# Patient Record
Sex: Female | Born: 1975 | Race: White | Hispanic: No | Marital: Married | Smoking: Never smoker
Health system: Southern US, Community
[De-identification: ages and names within clinical notes are randomized; demographics above are authoritative.]

## PROBLEM LIST (undated history)

## (undated) DIAGNOSIS — N651 Disproportion of reconstructed breast: Secondary | ICD-10-CM

## (undated) DIAGNOSIS — Z9889 Other specified postprocedural states: Secondary | ICD-10-CM

## (undated) DIAGNOSIS — T4145XA Adverse effect of unspecified anesthetic, initial encounter: Secondary | ICD-10-CM

## (undated) DIAGNOSIS — Z98811 Dental restoration status: Secondary | ICD-10-CM

## (undated) DIAGNOSIS — Z86000 Personal history of in-situ neoplasm of breast: Secondary | ICD-10-CM

## (undated) DIAGNOSIS — R112 Nausea with vomiting, unspecified: Secondary | ICD-10-CM

## (undated) DIAGNOSIS — T8859XA Other complications of anesthesia, initial encounter: Secondary | ICD-10-CM

## (undated) HISTORY — PX: WISDOM TOOTH EXTRACTION: SHX21

---

## 2014-05-30 ENCOUNTER — Other Ambulatory Visit: Payer: Self-pay | Admitting: Obstetrics and Gynecology

## 2014-05-30 DIAGNOSIS — R921 Mammographic calcification found on diagnostic imaging of breast: Secondary | ICD-10-CM

## 2014-06-07 ENCOUNTER — Ambulatory Visit
Admission: RE | Admit: 2014-06-07 | Discharge: 2014-06-07 | Disposition: A | Discharge status: Home / Self Care | Payer: No Typology Code available for payment source | Source: Ambulatory Visit | Attending: Obstetrics and Gynecology | Admitting: Obstetrics and Gynecology

## 2014-06-07 DIAGNOSIS — R921 Mammographic calcification found on diagnostic imaging of breast: Secondary | ICD-10-CM

## 2014-06-07 HISTORY — PX: BREAST BIOPSY: SHX20

## 2014-06-10 ENCOUNTER — Ambulatory Visit
Admission: RE | Admit: 2014-06-10 | Discharge: 2014-06-10 | Disposition: A | Discharge status: Home / Self Care | Payer: No Typology Code available for payment source | Source: Ambulatory Visit | Attending: Obstetrics and Gynecology | Admitting: Obstetrics and Gynecology

## 2014-06-10 ENCOUNTER — Other Ambulatory Visit: Payer: Self-pay | Admitting: Obstetrics and Gynecology

## 2014-06-10 DIAGNOSIS — R921 Mammographic calcification found on diagnostic imaging of breast: Secondary | ICD-10-CM

## 2014-06-14 ENCOUNTER — Other Ambulatory Visit: Payer: Self-pay | Admitting: *Deleted

## 2014-06-14 ENCOUNTER — Other Ambulatory Visit: Payer: Self-pay | Admitting: General Surgery

## 2014-06-14 DIAGNOSIS — D0511 Intraductal carcinoma in situ of right breast: Secondary | ICD-10-CM

## 2014-06-17 ENCOUNTER — Telehealth: Payer: Self-pay | Admitting: *Deleted

## 2014-06-17 NOTE — Telephone Encounter (Signed)
Received office note from Dr. Donne Hazel.  Placed copy in Dr. Geralyn Flash box and took one to HIM to scan.

## 2014-06-17 NOTE — Telephone Encounter (Signed)
Received an email stating that the pt needs an urgent genetic appt.  Called pt and confirmed 06/18/14 appt.  Emailed Anderson Malta and Dr. Donne Hazel at [facility] to make them aware.

## 2014-06-17 NOTE — Telephone Encounter (Signed)
Received referral from [facility].  Called pt and confirmed 06/25/14 appt w/ pt.  Mailed before appt letter, calendar, welcoming packet & intake form to pt. Emailed Dr. Donne Hazel at [facility] requesting his office note to be faxed after seeing pt.  Emailed Engineer, civil (consulting) at [employer] to make her aware.  Beth emailed Tiffany about PCP referral.

## 2014-06-18 ENCOUNTER — Other Ambulatory Visit: Payer: No Typology Code available for payment source

## 2014-06-18 ENCOUNTER — Ambulatory Visit
Admission: RE | Admit: 2014-06-18 | Discharge: 2014-06-18 | Disposition: A | Discharge status: Home / Self Care | Payer: No Typology Code available for payment source | Source: Ambulatory Visit | Attending: General Surgery | Admitting: General Surgery

## 2014-06-18 ENCOUNTER — Encounter: Payer: Self-pay | Admitting: Genetic Counselor

## 2014-06-18 ENCOUNTER — Ambulatory Visit (HOSPITAL_BASED_OUTPATIENT_CLINIC_OR_DEPARTMENT_OTHER): Payer: No Typology Code available for payment source | Admitting: Genetic Counselor

## 2014-06-18 DIAGNOSIS — D0511 Intraductal carcinoma in situ of right breast: Secondary | ICD-10-CM

## 2014-06-18 DIAGNOSIS — Z8042 Family history of malignant neoplasm of prostate: Secondary | ICD-10-CM

## 2014-06-18 DIAGNOSIS — Z800 Family history of malignant neoplasm of digestive organs: Secondary | ICD-10-CM

## 2014-06-18 DIAGNOSIS — Z809 Family history of malignant neoplasm, unspecified: Secondary | ICD-10-CM

## 2014-06-18 DIAGNOSIS — Z803 Family history of malignant neoplasm of breast: Secondary | ICD-10-CM | POA: Diagnosis not present

## 2014-06-18 DIAGNOSIS — Z808 Family history of malignant neoplasm of other organs or systems: Secondary | ICD-10-CM

## 2014-06-18 DIAGNOSIS — D0591 Unspecified type of carcinoma in situ of right breast: Secondary | ICD-10-CM

## 2014-06-18 DIAGNOSIS — Z8049 Family history of malignant neoplasm of other genital organs: Secondary | ICD-10-CM

## 2014-06-18 DIAGNOSIS — Z315 Encounter for genetic counseling: Secondary | ICD-10-CM

## 2014-06-18 MED ORDER — GADOBENATE DIMEGLUMINE 529 MG/ML IV SOLN: 9.0000 mL | Freq: Once | INTRAVENOUS | Status: AC | PRN

## 2014-06-18 NOTE — Progress Notes (Signed)
REFERRING PROVIDER: Rolm Bookbinder, MD Tara Lose, MD  PRIMARY PROVIDER:  No primary care provider on file.  PRIMARY REASON FOR VISIT:  1. Breast cancer in situ, right   2. Family history of breast cancer in female   3. Family history of prostate cancer   4. Family history of uterine cancer   5. Family history of liver cancer   6. Family history of skin cancer   7. Family history of cancer      HISTORY OF PRESENT ILLNESS:   Ms. Tara Fuller, a 39 y.o. female, was seen for a [facility] cancer genetics consultation at the request of Dr. Donne Fuller due to a personal history of breast cancer at the age of 32 and family history of breast and other cancers.  Ms. Tara Fuller presents to clinic today with her husband to discuss the possibility of a hereditary predisposition to cancer, genetic testing, and to further clarify her future cancer risks, as well as potential cancer risks for family members.   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 The hormone receptor status of the breast cancer was ER/PR+.  Treatment/surgical management decisions will be made over the next few weeks.   CANCER HISTORY:   No history exists.  June 2016 - DCIS of right breast; ER/PR+   HORMONAL RISK FACTORS:  Menarche was at age 57.  First live birth at age 59.  OCP use for approximately 15 years.  Ovaries intact: yes.  Hysterectomy: no.  Menopausal status: premenopausal.  HRT use: 0 years. Colonoscopy: no; not examined. Mammogram within the last year: yes. Number of breast biopsies: 1. Up to date with pelvic exams:  yes. Any excessive radiation exposure in the past:  no  Past Medical History  Diagnosis Date  . Breast cancer 06/07/14    R DCIS +calcifications    History reviewed. No pertinent past surgical history.  History   Social History  . Marital Status: Married    Spouse Name: N/A  . Number of Children: N/A  . Years of Education: N/A    Social History Main Topics  . Smoking status: Never Smoker   . Smokeless tobacco: Not on file  . Alcohol Use: Yes     Comment: occ  . Drug Use: Not on file  . Sexual Activity: Not on file   Other Topics Concern  . None   Social History Narrative  . None     FAMILY HISTORY:  We obtained a detailed, 4-generation family history.  Significant diagnoses are listed below: Family History  Problem Relation Age of Onset  . Endometriosis Sister   . Liver cancer Maternal Aunt     dx. 21s  . Crohn's disease Maternal Uncle   . Uterine cancer Maternal Grandmother   . Prostate cancer Maternal Grandfather     dx. 60s  . Skin cancer Paternal Grandfather   . Breast cancer Maternal Aunt 58  . Breast cancer Maternal Aunt 41    previous (-) genetic testing    Ms. Tara Fuller has a maternal family history of breast and other cancers.  Two maternal aunts were diagnosed with breast cancer at the ages of 38 and 69.  One of those maternal aunts had negative genetic testing for a custom panel of 13 genes related to hereditary breast cancer through [employer].  Another maternal aunt was diagnosed with liver cancer in her 65s.  Three paternal uncles have never had cancer.  Ms.  Tara Fuller's maternal grandmother was previously diagnosed with uterine cancer, and her maternal grandfather was diagnosed with prostate cancer in his 32s.  Ms. Tara Fuller maternal great grandmother (through her grandmother) was diagnosed with an unspecified type of cancer.  Ms. Tara Fuller mother, currently 32, has never had cancer, nor have any of Ms. Tara Fuller's two full sisters and two full brothers.  Ms. Tara Fuller also has two sons, 58 and 34, who are cancer-free.      Ms. Tara Fuller has a paternal family history of skin cancer in her paternal grandfather.  Ms. Tara Fuller father is 43 and has never had cancer, nor have any of his siblings.    Ms. Tara Fuller maternal ancestors are of [place] descent, and her paternal ancestors are  of [place] descent. There is no reported Ashkenazi Jewish ancestry. There is no known consanguinity.  GENETIC COUNSELING ASSESSMENT: Tara Fuller is a 39 y.o. female with a personal history of breast cancer and family history of cancer which is somewhat suggestive of a hereditary breast cancer syndrome and predisposition to cancer. We, therefore, discussed and recommended the following at today's visit.   DISCUSSION: We reviewed the characteristics, features and inheritance patterns of hereditary cancer syndromes. We also discussed genetic testing, including the appropriate family members to test, the process of testing, insurance coverage and turn-around-time for results. We discussed the implications of a negative, positive and/or variant of uncertain significant result. We recommended Ms. Tara Fuller pursue genetic testing for the 21-gene Breast/Ovarian Cancer gene panel through [facility] Tara Pigeon, MD).   Based on Ms. Tara Fuller's personal and family history of cancer, she meets medical criteria for genetic testing. Despite that she meets criteria, she may still have an out of pocket cost. We discussed that if her out of pocket cost for testing is over $100, the laboratory will call and confirm whether she wants to proceed with testing.  If the out of pocket cost of testing is less than $100 she will be billed by the genetic testing laboratory.   PLAN: After considering the risks, benefits, and limitations, Ms. Tara Fuller  provided informed consent to pursue genetic testing and the blood sample was sent to [facility] for analysis of the Breast/Ovarian Cancer panel.  The Breast/Ovarian gene panel offered by [facility] includes sequencing and deletion/duplication analysis for the following 20 genes:  ATM, BARD1, BRCA1, BRCA2, BRIP1, CDH1, CHEK2, FANCC, MLH1, MSH2, MSH6, NBN, PALB2, PMS2, PTEN, RAD51C, RAD51D, STK11, TP53, and XRCC2.  Additionally, the panel includes deletion/duplication (without  next-generation sequencing) analysis of EPCAM.    Since Ms. Tara Fuller plans to make treatment/surgical decisions based upon these test results, we will let [facility] know that we need results STAT for this reason.  Thus, results should be available within approximately 2 weeks' time, at which point they will be disclosed by telephone to Ms. Giambra, as will any additional recommendations warranted by these results. Ms. Pyeatt will receive a summary of her genetic counseling visit and a copy of her results once available. This information will also be available in Epic. We encouraged Ms. Charley to remain in contact with cancer genetics annually so that we can continuously update the family history and inform her of any changes in cancer genetics and testing that may be of benefit for her family. Ms. Papandrea questions were answered to her satisfaction today. Our contact information was provided should additional questions or concerns arise.   Lastly, we encouraged Ms. Hiebert to remain in contact with cancer genetics annually so that we can continuously  update the family history and inform her of any changes in cancer genetics and testing that may be of benefit for this family.  Thank you for the referral and allowing [place] to share in the care of your patient.   Jeanine Luz, MS Genetic Counselor [email or web address] phone: [phone]  The patient was seen for a total of 60 minutes in face-to-face genetic counseling.  This patient was discussed with Drs. Magrinat, Lindi Adie and/or Burr Medico who agrees with the above.    _______________________________________________________________________ For Office Staff:  Number of people involved in session: 2 Was an Intern/ student involved with case: no

## 2014-06-19 ENCOUNTER — Other Ambulatory Visit: Payer: Self-pay | Admitting: General Surgery

## 2014-06-19 DIAGNOSIS — C50411 Malignant neoplasm of upper-outer quadrant of right female breast: Secondary | ICD-10-CM | POA: Insufficient documentation

## 2014-06-19 DIAGNOSIS — D0511 Intraductal carcinoma in situ of right breast: Secondary | ICD-10-CM

## 2014-06-25 ENCOUNTER — Ambulatory Visit: Payer: No Typology Code available for payment source

## 2014-06-25 ENCOUNTER — Encounter: Payer: Self-pay | Admitting: *Deleted

## 2014-06-25 ENCOUNTER — Encounter: Payer: Self-pay | Admitting: Hematology and Oncology

## 2014-06-25 ENCOUNTER — Ambulatory Visit (HOSPITAL_BASED_OUTPATIENT_CLINIC_OR_DEPARTMENT_OTHER): Payer: No Typology Code available for payment source | Admitting: Hematology and Oncology

## 2014-06-25 VITALS — BP 134/79 | HR 99 | Temp 97.8°F | Resp 18 | Ht 63.0 in | Wt 111.5 lb

## 2014-06-25 DIAGNOSIS — C50911 Malignant neoplasm of unspecified site of right female breast: Secondary | ICD-10-CM

## 2014-06-25 DIAGNOSIS — Z170 Estrogen receptor positive status [ER+]: Secondary | ICD-10-CM

## 2014-06-25 DIAGNOSIS — D0511 Intraductal carcinoma in situ of right breast: Secondary | ICD-10-CM | POA: Diagnosis not present

## 2014-06-25 NOTE — Progress Notes (Signed)
Oncology Nurse Navigator Documentation  Oncology Nurse Navigator Flowsheets 06/25/2014  Referral date to RadOnc/MedOnc 06/25/2014  Navigator Encounter Type Initial MedOnc  Patient Visit Type Medonc  Treatment Phase Other  Barriers/Navigation Needs No barriers at this time  Time Spent with Patient 37   Spoke with patient today at her visit with Dr. Lindi Adie.  Discussed navigation resources.  Patient is waiting on genetic results before making a surgical decision.  Encouraged her to call with any needs or concerns.

## 2014-06-25 NOTE — Progress Notes (Signed)
Checked in new pt with no financial concerns prior to seeing the dr.  Pt has my card for any billing questions, concerns or if financial assistance is needed.  ° °

## 2014-06-25 NOTE — Assessment & Plan Note (Signed)
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 Breast MRI revealed 3.8 x 2.4 x 1.7 cm postbiopsy hematoma right breast centered at 6:00 position  Pathology radiology review: I discussed with the patient the difference between invasive cancer and DCIS. We discussed the significance of ER and PR receptors and the implications for treatment decisions. With surgery alone based on [facility], the risk of recurrence over the next 10 years was a 12%. With antiestrogen therapy the risk is reduced to 3%.  Recommendation: 1. Right mastectomy followed by 2. Antiestrogen therapy with tamoxifen 20 mg daily 5 years  Tamoxifen counseling:We discussed the risks and benefits of tamoxifen. These include but not limited to insomnia, hot flashes, mood changes, vaginal dryness, and weight gain. Although rare, serious side effects including endometrial cancer, risk of blood clots were also discussed. We strongly believe that the benefits far outweigh the risks. Patient understands these risks and consented to starting treatment. Planned treatment duration is 5 years.  Return to clinic after surgery to discuss final pathology result.

## 2014-06-25 NOTE — Progress Notes (Signed)
[place] NOTE  Patient Care Team: Karlene Einstein, MD as PCP - General (Family Medicine)  CHIEF COMPLAINTS/PURPOSE OF CONSULTATION:  Newly diagnosed right breast DCIS  HISTORY OF PRESENTING ILLNESS:  Tara Fuller 39 y.o. female is here because of recent diagnosis of right breast DCIS. Patient had abnormalities in the right breast over the past 2 years with occasional calcifications that were followed periodically with mammograms and finally she underwent a biopsy on 06/07/2014 which revealed DCIS with calcifications that was not intermediate grade ER was 90% PR 30%. She subsequently underwent a breast MRI that showed postbiopsy hematoma. Because of her young age diagnosis, genetic counseling was performed. The results are not available yet. She is here today accompanied by her husband to discuss a treatment plan.  I reviewed her records extensively and collaborated the history with the patient.  SUMMARY OF ONCOLOGIC HISTORY:   Breast cancer, right breast   06/07/2014 Initial Diagnosis Right breast biopsy: Lower Outer quadrant: DCIS with calcifications, low to intermediate grade, ER 90%, PR 30%   06/18/2014 Breast MRI Right breast: 3.8 cm post biopsy hematoma centered in the 6 o'clock position of the right breast, posteriorly    MEDICAL HISTORY:  Past Medical History  Diagnosis Date  . 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 Endometriosis Sister   . Liver cancer Maternal Aunt     dx. 34s  . Crohn's disease Maternal Uncle   . Uterine cancer Maternal Grandmother   . Prostate cancer Maternal Grandfather     dx. 60s  . Skin cancer Paternal Grandfather   . Breast cancer Maternal Aunt  53  . Breast cancer Maternal Aunt 41    previous (-) genetic testing    ALLERGIES:  has No Known Allergies.  MEDICATIONS:  Current Outpatient Prescriptions  Medication Sig Dispense Refill  . Multiple Vitamins-Minerals (ONE DAILY MULTIVITAMIN WOMEN PO) Take by mouth daily.     No current facility-administered medications for this visit.    REVIEW OF SYSTEMS:   Constitutional: Denies fevers, chills or abnormal night sweats Eyes: Denies blurriness of vision, double vision or watery eyes Ears, nose, mouth, throat, and face: Denies mucositis or sore throat Respiratory: Denies cough, dyspnea or wheezes Cardiovascular: Denies palpitation, chest discomfort or lower extremity swelling Gastrointestinal:  Denies nausea, heartburn or change in bowel habits Skin: Denies abnormal skin rashes Lymphatics: Denies new lymphadenopathy or easy bruising Neurological:Denies numbness, tingling or new weaknesses Behavioral/Psych: Mood is stable, no new changes  Breast: Postbiopsy bleeding All other systems were reviewed with the patient and are negative.  PHYSICAL EXAMINATION: ECOG PERFORMANCE STATUS: 1 - Symptomatic but completely ambulatory  Filed Vitals:   06/25/14 1259  BP: 134/79  Pulse: 99  Temp: 97.8 F (36.6 C)  Resp: 18   Filed Weights   06/25/14 1259  Weight: 111 lb 8 oz (50.576 kg)    GENERAL:alert, no distress and comfortable SKIN: skin color, texture, turgor are normal, no rashes or significant lesions EYES: normal, conjunctiva are pink and non-injected, sclera clear OROPHARYNX:no exudate, no erythema and lips, buccal mucosa, and tongue normal  NECK: supple, thyroid normal size, non-tender, without nodularity LYMPH:  no palpable lymphadenopathy in the cervical,  axillary or inguinal LUNGS: clear to auscultation and percussion with normal breathing effort HEART: regular rate & rhythm and no murmurs and no lower extremity edema ABDOMEN:abdomen soft, non-tender and normal bowel  sounds Musculoskeletal:no cyanosis of digits and no clubbing  PSYCH: alert & oriented x 3 with fluent speech NEURO: no focal motor/sensory deficits  RADIOGRAPHIC STUDIES: I have personally reviewed the radiological reports and agreed with the findings in the report.  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 Breast MRI revealed 3.8 x 2.4 x 1.7 cm postbiopsy hematoma right breast centered at 6:00 position  Pathology radiology review: I discussed with the patient the difference between invasive cancer and DCIS. We discussed the significance of ER and PR receptors and the implications for treatment decisions. With surgery alone based on [facility], the risk of recurrence over the next 10 years was a 12%. With antiestrogen therapy the risk is reduced to 3%.  Recommendation: 1. Right mastectomy followed by 2. Antiestrogen therapy with tamoxifen 20 mg daily 5 years  Tamoxifen counseling:We discussed the risks and benefits of tamoxifen. These include but not limited to insomnia, hot flashes, mood changes, vaginal dryness, and weight gain. Although rare, serious side effects including endometrial cancer, risk of blood clots were also discussed. We strongly believe that the benefits far outweigh the risks. Patient understands these risks and consented to starting treatment. Planned treatment duration is 5 years.  Return to clinic after surgery to discuss final pathology result.   All questions were answered. The patient knows to call the clinic with any problems, questions or concerns.    Rulon Eisenmenger, MD 1:42 PM

## 2014-07-02 ENCOUNTER — Other Ambulatory Visit: Payer: Self-pay

## 2014-07-03 ENCOUNTER — Encounter: Payer: Self-pay | Admitting: Genetic Counselor

## 2014-07-03 ENCOUNTER — Telehealth: Payer: Self-pay | Admitting: Genetic Counselor

## 2014-07-03 DIAGNOSIS — Z809 Family history of malignant neoplasm, unspecified: Secondary | ICD-10-CM | POA: Insufficient documentation

## 2014-07-03 DIAGNOSIS — C50911 Malignant neoplasm of unspecified site of right female breast: Secondary | ICD-10-CM

## 2014-07-03 DIAGNOSIS — Z803 Family history of malignant neoplasm of breast: Secondary | ICD-10-CM | POA: Insufficient documentation

## 2014-07-03 DIAGNOSIS — Z1379 Encounter for other screening for genetic and chromosomal anomalies: Secondary | ICD-10-CM | POA: Insufficient documentation

## 2014-07-03 NOTE — H&P (Signed)
  [name] is a 39 y.o. female.  HPI  Referred by Dr. Donne Hazel for consultation for breast reconstruction. Patient diagnosed with DCIS Right breast, ER/PR + following screening MMG showing change in calcifications. MRI with no masses or abnormal enhancement bilateral. Calcifications span 5 cm. Given this, mastectomy has been recommended. Dr. Lindi Adie has discussed tamoxifen, will see again post op. Genetics results pending. Two aunts with breast ca, at least one negative genetic testing.  Current 323/34 A cup. Has been happy with this. Wt stable   Review of Systems 12 point review negative    Objective:   Physical Exam  Cardiovascular: Normal rate.  Pulmonary/Chest: Effort normal.  Abdominal: Soft.  No real redundant tissue, soft  Genitourinary: No breast discharge.  Resolving hematoma on right, no ptosis bilat, no palpable masses SN to nipple R 18 L 19 cm BW R 12 L 122 cm Nipple to IMF R 6 L 5.5 cm  Slight pectus deformity  Skin:  Rodney Booze 2       Assessment:     R breast DCIS Family history breast cancer     Plan:     Reviewed immediate vs delayed, implant based vs autologous reconstruction. Discussed duration surgery, hospital stay, drains, incisions for each. Reviewed implant specific risks inc infection, rupture, contracture, need for replacement. Discussed process of expansion, timing of future surgeries, drains, post procedure limitations. Discussed use of ADM, nipple sparing mastectomy incisions and that NAC would be asensate. Counseled she has minimal tissue to Provide pure autologous reconstruction.Reviewed symmetry procedures including implant. Reviewed examples of implants and portfolio. Patient asked what happens if tissue does not appear healthy during surgery and I counseled it is possible I would not place expander if I felt soft tissue would not support.  Patient plans to proceed with expander based reconstruction with acellular  dermis, nipple sparing mastectomy.Reviewed additional risks including but not limited to seroma, hematoma, damage to deeper structures, unacceptable cosmetic appearance, DVT/PE, cardiopulmonary complications.  Irene Limbo, MD [facility] [phone]

## 2014-07-03 NOTE — Telephone Encounter (Signed)
Informed Ms. Tara Fuller that her genetic test results were negative for any genetic changes causing increased risks for cancer.  We still do not have an explanation for the family history of for why she got breast cancer at a young age.  In the future we may have better testing options available.  Medical management should be based upon the personal and family history of cancer.

## 2014-07-03 NOTE — Progress Notes (Signed)
GENETIC TEST RESULTS  HPI: Ms. Bungert was previously seen in the [facility] due to a personal history of breast cancer at age 39, family history of breast and other cancer, and concerns regarding a hereditary predisposition to cancer. Please refer to our prior cancer genetics clinic note from June 18, 2014 for more information regarding Ms. Taillon's medical, social and family histories, and our assessment and recommendations, at the time. Ms. Costello recent genetic test results were disclosed to her, as were recommendations warranted by these results. These results and recommendations are discussed in more detail below.  GENETIC TEST RESULTS: At the time of Ms. Skoda's visit on 06/18/14, we recommended she pursue genetic testing of the 21-gene Breast/Ovarian Cancer panel through [facility].   The Breast/Ovarian gene panel offered by [facility] includes next-generation sequencing and deletion/duplication analysis for the following 20 genes:  ATM, BARD1, BRCA1, BRCA2, BRIP1, CDH1, CHEK2, FANCC, MLH1, MSH2, MSH6, NBN, PALB2, PMS2, PTEN, RAD51C, RAD51D, STK11, TP53, and XRCC2.  Additionally, the panel includes deletion/duplication analysis (without sequencing) for one gene, EPCAM. Those results are now back, the report date of which is 07/03/14.  Genetic testing was normal, and did not reveal a deleterious mutation in these genes. Additionally, no variants of uncertain significance (VUSs) were found.  The test report will be scanned into EPIC and will be located under the Media tab.   We discussed with Ms. Diamond that since the current genetic testing is not perfect, it is possible there may be a gene mutation in one of these genes that current testing cannot detect, but that chance is small. We also discussed, that it is possible that another gene that has not yet been discovered, or that we have not yet tested, is responsible for the cancer diagnoses in the family, and it is, therefore,  important to remain in touch with cancer genetics in the future so that we can continue to offer Ms. Krantz the most up to date genetic testing.   CANCER SCREENING RECOMMENDATIONS:   We still do not have an explanation for why Ms. Kendrick was diagnosed with cancer at a young age, and we also do not have an explanation for the family history of breast cancer.  Most cancers happen by chance and this negative test suggests that her cancer falls into this category.  However, given Ms. Hallgren's personal and family histories, we might interpret these negative results with some caution.  Families with features suggestive of hereditary risk for cancer tend to have multiple family members with cancer, diagnoses in multiple generations and diagnoses before the age of 47. Ms. Provencal family exhibits some of these features. Thus this result may simply reflect our current inability to detect all mutations within these genes or there may be a different gene that has not yet been discovered or tested.   It is reasonable for Ms. Volkert to be followed by a high-risk breast cancer clinic; in addition to a yearly mammogram and physical exam by a healthcare provider, she should discuss the usefulness of an annual breast MRI with the high-risk clinic providers.   RECOMMENDATIONS FOR FAMILY MEMBERS: Women in this family might be at some increased risk of developing cancer, over the general population risk, simply due to the family history of cancer. We recommended women in this family have a yearly mammogram beginning at age 12, or 28 years younger than the earliest onset of cancer, an annual clinical breast exam, and perform monthly breast self-exams. Women in this  family should also have a gynecological exam as recommended by their primary provider. All family members should have a colonoscopy by age 62.  FOLLOW-UP: Lastly, we discussed with Ms. Bocchino that cancer genetics is a rapidly advancing field and it is  possible that new genetic tests will be appropriate for her and/or her family members in the future. We encouraged her to remain in contact with cancer genetics on an annual basis so we can update her personal and family histories and let her know of advances in cancer genetics that may benefit this family.   Our contact number was provided. Ms. Dolney questions were answered to her satisfaction, and she knows she is welcome to call us at anytime with additional questions or concerns.   Jeanine Luz MS Genetic Counselor [email or web address] Phone: [phone]

## 2014-07-05 ENCOUNTER — Other Ambulatory Visit: Payer: Self-pay | Admitting: General Surgery

## 2014-07-05 ENCOUNTER — Encounter (HOSPITAL_COMMUNITY): Payer: Self-pay

## 2014-07-05 ENCOUNTER — Encounter (HOSPITAL_COMMUNITY)
Admission: RE | Admit: 2014-07-05 | Discharge: 2014-07-05 | Disposition: A | Discharge status: Home / Self Care | Payer: No Typology Code available for payment source | Source: Ambulatory Visit | Attending: General Surgery | Admitting: General Surgery

## 2014-07-05 DIAGNOSIS — Z01812 Encounter for preprocedural laboratory examination: Secondary | ICD-10-CM | POA: Diagnosis not present

## 2014-07-05 DIAGNOSIS — C50911 Malignant neoplasm of unspecified site of right female breast: Secondary | ICD-10-CM

## 2014-07-05 DIAGNOSIS — Z86000 Personal history of in-situ neoplasm of breast: Secondary | ICD-10-CM

## 2014-07-05 DIAGNOSIS — D0511 Intraductal carcinoma in situ of right breast: Secondary | ICD-10-CM | POA: Insufficient documentation

## 2014-07-05 DIAGNOSIS — Z01818 Encounter for other preprocedural examination: Secondary | ICD-10-CM | POA: Diagnosis not present

## 2014-07-05 HISTORY — DX: Personal history of in-situ neoplasm of breast: Z86.000

## 2014-07-05 LAB — CBC WITH DIFFERENTIAL/PLATELET
Basophils Absolute: 0 10*3/uL (ref 0.0–0.1)
Basophils Relative: 0 % (ref 0–1)
EOS ABS: 0.1 10*3/uL (ref 0.0–0.7)
EOS PCT: 1 % (ref 0–5)
HEMATOCRIT: 41.1 % (ref 36.0–46.0)
Hemoglobin: 14.1 g/dL (ref 12.0–15.0)
LYMPHS ABS: 1.8 10*3/uL (ref 0.7–4.0)
Lymphocytes Relative: 25 % (ref 12–46)
MCH: 30.3 pg (ref 26.0–34.0)
MCHC: 34.3 g/dL (ref 30.0–36.0)
MCV: 88.2 fL (ref 78.0–100.0)
Monocytes Absolute: 0.7 10*3/uL (ref 0.1–1.0)
Monocytes Relative: 9 % (ref 3–12)
NEUTROS ABS: 4.6 10*3/uL (ref 1.7–7.7)
NEUTROS PCT: 65 % (ref 43–77)
Platelets: 164 10*3/uL (ref 150–400)
RBC: 4.66 MIL/uL (ref 3.87–5.11)
RDW: 12.6 % (ref 11.5–15.5)
WBC: 7.1 10*3/uL (ref 4.0–10.5)

## 2014-07-05 LAB — BASIC METABOLIC PANEL
Anion gap: 7 (ref 5–15)
BUN: 13 mg/dL (ref 6–20)
CALCIUM: 9.2 mg/dL (ref 8.9–10.3)
CO2: 27 mmol/L (ref 22–32)
Chloride: 105 mmol/L (ref 101–111)
Creatinine, Ser: 0.83 mg/dL (ref 0.44–1.00)
GFR calc Af Amer: >60 mL/min (ref >60)
Glucose, Bld: 90 mg/dL (ref 65–99)
Potassium: 4.3 mmol/L (ref 3.5–5.1)
Sodium: 139 mmol/L (ref 135–145)

## 2014-07-05 NOTE — Pre-Procedure Instructions (Addendum)
[facility]  07/05/2014      [id number] - [facility] [facility], [place] - [address] AT [facility] [address] [facility] Phone: [phone] Fax: [phone]    Your procedure is scheduled on 07/09/14  Report to [facility] short stay admitting at 530 A.M.  Call this number if you have problems the morning of surgery:  [phone]   Remember:  Do not eat food or drink liquids after midnight.  Take these medicines the morning of surgery with A SIP OF WATER none    STOP all herbel meds, nsaids (aleve,naproxen,advil,ibuprofen) starting now including vitamins, aspirin   Do not wear jewelry, make-up or nail polish.  Do not wear lotions, powders, or perfumes.  You may not wear deodorant.  Do not shave 48 hours prior to surgery.  Men may shave face and neck.  Do not bring valuables to the hospital.  [facility] is not responsible for any belongings or valuables.  Contacts, dentures or bridgework may not be worn into surgery.  Leave your suitcase in the car.  After surgery it may be brought to your room.  For patients admitted to the hospital, discharge time will be determined by your treatment team.  Patients discharged the day of surgery will not be allowed to drive home.   Name and phone number of your driver:    Special instructions:   Special Instructions: [facility] - Preparing for Surgery  Before surgery, you can play an important role.  Because skin is not sterile, your skin needs to be as free of germs as possible.  You can reduce the number of germs on you skin by washing with CHG (chlorahexidine gluconate) soap before surgery.  CHG is an antiseptic cleaner which kills germs and bonds with the skin to continue killing germs even after washing.  Please DO NOT use if you have an allergy to CHG or antibacterial soaps.  If your skin becomes reddened/irritated stop using the CHG and inform your nurse when you arrive  at Short Stay.  Do not shave (including legs and underarms) for at least 48 hours prior to the first CHG shower.  You may shave your face.  Please follow these instructions carefully:   1.  Shower with CHG Soap the night before surgery and the morning of Surgery.  2.  If you choose to wash your hair, wash your hair first as usual with your normal shampoo.  3.  After you shampoo, rinse your hair and body thoroughly to remove the Shampoo.  4.  Use CHG as you would any other liquid soap.  You can apply chg directly  to the skin and wash gently with scrungie or a clean washcloth.  5.  Apply the CHG Soap to your body ONLY FROM THE NECK DOWN.  Do not use on open wounds or open sores.  Avoid contact with your eyes ears, mouth and genitals (private parts).  Wash genitals (private parts)       with your normal soap.  6.  Wash thoroughly, paying special attention to the area where your surgery will be performed.  7.  Thoroughly rinse your body with warm water from the neck down.  8.  DO NOT shower/wash with your normal soap after using and rinsing off the CHG Soap.  9.  Pat yourself dry with a clean towel.            10.  Wear clean pajamas.            11.  Place clean sheets on your bed the night of your first shower and do not sleep with pets.  Day of Surgery  Do not apply any lotions/deodorants the morning of surgery.  Please wear clean clothes to the hospital/surgery center.  Please read over the following fact sheets that you were given. Pain Booklet, Coughing and Deep Breathing and Surgical Site Infection Prevention

## 2014-07-05 NOTE — Progress Notes (Addendum)
Anesthesia PAT Evaluation: Patient is a 39 year old female scheduled for right nipple sparing mastectomy with right axillary sentinal LN biopsy (Dr. Donne Fuller) and right breast reconstruction with placement of tissue expander and Flex HD (Dr. Iran Fuller) on 07/08/13. Posted for GA with pectoral block.   History includes right breast DCIS, non-smoker, c-section complicated by spinal headaches treated with blood patch '08 ([facility]), heart murmur (teen?). BMI is 19.76. PCP is listed as Dr. Charlcie Fuller.   Meds: MVI.  Patient requested to see anesthesiologist to discuss pectoral block and typical anesthesia course for this procedure.  Dr. Linna Fuller spoke with patient and her husband.  She had a bad experience at [facility] due to her spinal headache and was also concerned about a medication she received that gave her a metallic taste in her mouth. 2008 Anesthesia records requested, but are still pending. She has never undergone general anesthesia.   Preoperative labs noted.   Anticipate that she can proceed as planned. Dr. Linna Fuller plans to assign himself to her case. If her 2008 anesthesia records have been received then he can review them as needed at that time. Of note, pregnancy test was not done at PAT, so will need to get on arrival.  [facility]Anesthesiology Phone [phone] 07/05/2014 4:44 PM

## 2014-07-08 MED ORDER — CEFAZOLIN SODIUM-DEXTROSE 2-3 GM-% IV SOLR: 2.0000 g | INTRAVENOUS | Status: DC

## 2014-07-08 MED ORDER — CEFAZOLIN SODIUM-DEXTROSE 2-3 GM-% IV SOLR
2.0000 g | INTRAVENOUS | Status: AC
  Administered 2014-07-09: 2 g via INTRAVENOUS
  Filled 2014-07-08: qty 50

## 2014-07-09 ENCOUNTER — Ambulatory Visit (HOSPITAL_COMMUNITY): Payer: No Typology Code available for payment source | Admitting: Vascular Surgery

## 2014-07-09 ENCOUNTER — Encounter (HOSPITAL_COMMUNITY)
Admission: RE | Disposition: A | Discharge status: Home / Self Care | Payer: Self-pay | Source: Ambulatory Visit | Attending: General Surgery

## 2014-07-09 ENCOUNTER — Ambulatory Visit (HOSPITAL_COMMUNITY)
Admission: RE | Admit: 2014-07-09 | Discharge: 2014-07-09 | Disposition: A | Discharge status: Home / Self Care | Payer: No Typology Code available for payment source | Source: Ambulatory Visit | Attending: General Surgery | Admitting: General Surgery

## 2014-07-09 ENCOUNTER — Ambulatory Visit (HOSPITAL_COMMUNITY): Payer: No Typology Code available for payment source | Admitting: Anesthesiology

## 2014-07-09 ENCOUNTER — Observation Stay (HOSPITAL_COMMUNITY)
Admission: RE | Admit: 2014-07-09 | Discharge: 2014-07-10 | Disposition: A | Discharge status: Home / Self Care | Payer: No Typology Code available for payment source | Source: Ambulatory Visit | Attending: General Surgery | Admitting: General Surgery

## 2014-07-09 ENCOUNTER — Encounter (HOSPITAL_COMMUNITY): Payer: Self-pay | Admitting: *Deleted

## 2014-07-09 DIAGNOSIS — D0510 Intraductal carcinoma in situ of unspecified breast: Secondary | ICD-10-CM | POA: Diagnosis present

## 2014-07-09 DIAGNOSIS — D0511 Intraductal carcinoma in situ of right breast: Principal | ICD-10-CM | POA: Insufficient documentation

## 2014-07-09 DIAGNOSIS — Z803 Family history of malignant neoplasm of breast: Secondary | ICD-10-CM | POA: Insufficient documentation

## 2014-07-09 DIAGNOSIS — C50911 Malignant neoplasm of unspecified site of right female breast: Secondary | ICD-10-CM

## 2014-07-09 HISTORY — PX: BREAST RECONSTRUCTION WITH PLACEMENT OF TISSUE EXPANDER AND FLEX HD (ACELLULAR HYDRATED DERMIS): SHX6295

## 2014-07-09 HISTORY — PX: MASTECTOMY: SHX3

## 2014-07-09 HISTORY — PX: NIPPLE SPARING MASTECTOMY/SENTINAL LYMPH NODE BIOPSY/RECONSTRUCTION/PLACEMENT OF TISSUE EXPANDER: SHX6484

## 2014-07-09 LAB — HCG, SERUM, QUALITATIVE: PREG SERUM: NEGATIVE

## 2014-07-09 SURGERY — NIPPLE SPARING MASTECTOMY WITH SENTINAL LYMPH NODE BIOPSY AND  RECONSTRUCTION WITH PLACEMENT OF TISSUE EXPANDER
Anesthesia: General | Site: Breast | Laterality: Right

## 2014-07-09 MED ORDER — OXYCODONE HCL 5 MG PO TABS
5.0000 mg | ORAL_TABLET | ORAL | Status: DC | PRN
  Administered 2014-07-09: 5 mg via ORAL
  Administered 2014-07-09 – 2014-07-10 (×3): 10 mg via ORAL
  Administered 2014-07-10: 5 mg via ORAL
  Administered 2014-07-10: 10 mg via ORAL
  Filled 2014-07-09 (×3): qty 2
  Filled 2014-07-09: qty 1
  Filled 2014-07-09: qty 2

## 2014-07-09 MED ORDER — PROPOFOL 10 MG/ML IV BOLUS
INTRAVENOUS | Status: DC | PRN
  Administered 2014-07-09: 150 mg via INTRAVENOUS

## 2014-07-09 MED ORDER — FENTANYL CITRATE (PF) 100 MCG/2ML IJ SOLN
25.0000 ug | INTRAMUSCULAR | Status: DC | PRN
  Administered 2014-07-09: 25 ug via INTRAVENOUS
  Administered 2014-07-09: 50 ug via INTRAVENOUS
  Administered 2014-07-09: 25 ug via INTRAVENOUS

## 2014-07-09 MED ORDER — LIDOCAINE HCL (CARDIAC) 20 MG/ML IV SOLN
INTRAVENOUS | Status: DC | PRN
  Administered 2014-07-09: 40 mg via INTRAVENOUS

## 2014-07-09 MED ORDER — FENTANYL CITRATE (PF) 100 MCG/2ML IJ SOLN
INTRAMUSCULAR | Status: DC | PRN
  Administered 2014-07-09: 50 ug via INTRAVENOUS
  Administered 2014-07-09: 100 ug via INTRAVENOUS
  Administered 2014-07-09 (×2): 50 ug via INTRAVENOUS

## 2014-07-09 MED ORDER — SODIUM CHLORIDE 0.9 % IV SOLN
INTRAVENOUS | Status: DC | PRN
  Administered 2014-07-09: 1000 mL

## 2014-07-09 MED ORDER — LACTATED RINGERS IV SOLN
INTRAVENOUS | Status: DC | PRN
  Administered 2014-07-09: 07:00:00 via INTRAVENOUS

## 2014-07-09 MED ORDER — ACETAMINOPHEN 650 MG RE SUPP: 650.0000 mg | Freq: Four times a day (QID) | RECTAL | Status: DC | PRN

## 2014-07-09 MED ORDER — STERILE WATER FOR INJECTION IJ SOLN
INTRAMUSCULAR | Status: AC
  Filled 2014-07-09: qty 10

## 2014-07-09 MED ORDER — FENTANYL CITRATE (PF) 250 MCG/5ML IJ SOLN
INTRAMUSCULAR | Status: AC
  Filled 2014-07-09: qty 5

## 2014-07-09 MED ORDER — DEXAMETHASONE SODIUM PHOSPHATE 4 MG/ML IJ SOLN
INTRAMUSCULAR | Status: DC | PRN
  Administered 2014-07-09: 8 mg via INTRAVENOUS

## 2014-07-09 MED ORDER — NEOSTIGMINE METHYLSULFATE 10 MG/10ML IV SOLN
INTRAVENOUS | Status: DC | PRN
  Administered 2014-07-09: 3 mg via INTRAVENOUS

## 2014-07-09 MED ORDER — SODIUM CHLORIDE 0.9 % IV SOLN
INTRAVENOUS | Status: DC
  Administered 2014-07-09: 75 mL/h via INTRAVENOUS
  Administered 2014-07-10: 06:00:00 via INTRAVENOUS

## 2014-07-09 MED ORDER — PHENYLEPHRINE HCL 10 MG/ML IJ SOLN
INTRAMUSCULAR | Status: DC | PRN
  Administered 2014-07-09 (×3): 80 ug via INTRAVENOUS

## 2014-07-09 MED ORDER — GLYCOPYRROLATE 0.2 MG/ML IJ SOLN
INTRAMUSCULAR | Status: AC
  Filled 2014-07-09: qty 2

## 2014-07-09 MED ORDER — ROCURONIUM BROMIDE 100 MG/10ML IV SOLN
INTRAVENOUS | Status: DC | PRN
  Administered 2014-07-09: 40 mg via INTRAVENOUS
  Administered 2014-07-09: 10 mg via INTRAVENOUS

## 2014-07-09 MED ORDER — ONDANSETRON HCL 4 MG/2ML IJ SOLN
INTRAMUSCULAR | Status: AC
  Filled 2014-07-09: qty 2

## 2014-07-09 MED ORDER — 0.9 % SODIUM CHLORIDE (POUR BTL) OPTIME
TOPICAL | Status: DC | PRN
  Administered 2014-07-09 (×2): 1000 mL

## 2014-07-09 MED ORDER — MIDAZOLAM HCL 2 MG/2ML IJ SOLN
INTRAMUSCULAR | Status: AC
  Filled 2014-07-09: qty 2

## 2014-07-09 MED ORDER — PROPOFOL 10 MG/ML IV BOLUS
INTRAVENOUS | Status: AC
  Filled 2014-07-09: qty 20

## 2014-07-09 MED ORDER — GLYCOPYRROLATE 0.2 MG/ML IJ SOLN
INTRAMUSCULAR | Status: DC | PRN
  Administered 2014-07-09: 0.4 mg via INTRAVENOUS

## 2014-07-09 MED ORDER — DIAZEPAM 5 MG PO TABS
ORAL_TABLET | ORAL | Status: AC
  Filled 2014-07-09: qty 1

## 2014-07-09 MED ORDER — DIAZEPAM 2 MG PO TABS
2.0000 mg | ORAL_TABLET | Freq: Three times a day (TID) | ORAL | Status: DC | PRN
  Administered 2014-07-09 – 2014-07-10 (×3): 2 mg via ORAL
  Filled 2014-07-09 (×3): qty 1

## 2014-07-09 MED ORDER — EPHEDRINE SULFATE 50 MG/ML IJ SOLN
INTRAMUSCULAR | Status: AC
  Filled 2014-07-09: qty 1

## 2014-07-09 MED ORDER — ACETAMINOPHEN 325 MG PO TABS: 650.0000 mg | ORAL_TABLET | Freq: Four times a day (QID) | ORAL | Status: DC | PRN

## 2014-07-09 MED ORDER — NEOSTIGMINE METHYLSULFATE 10 MG/10ML IV SOLN
INTRAVENOUS | Status: AC
  Filled 2014-07-09: qty 1

## 2014-07-09 MED ORDER — SUCCINYLCHOLINE CHLORIDE 20 MG/ML IJ SOLN
INTRAMUSCULAR | Status: AC
  Filled 2014-07-09: qty 1

## 2014-07-09 MED ORDER — ROCURONIUM BROMIDE 50 MG/5ML IV SOLN
INTRAVENOUS | Status: AC
  Filled 2014-07-09: qty 1

## 2014-07-09 MED ORDER — MORPHINE SULFATE 2 MG/ML IJ SOLN: 2.0000 mg | INTRAMUSCULAR | Status: DC | PRN

## 2014-07-09 MED ORDER — FENTANYL CITRATE (PF) 100 MCG/2ML IJ SOLN
INTRAMUSCULAR | Status: AC
  Administered 2014-07-09: 25 ug via INTRAVENOUS
  Filled 2014-07-09: qty 2

## 2014-07-09 MED ORDER — ONDANSETRON HCL 4 MG/2ML IJ SOLN: 4.0000 mg | Freq: Once | INTRAMUSCULAR | Status: DC | PRN

## 2014-07-09 MED ORDER — TECHNETIUM TC 99M SULFUR COLLOID FILTERED
1.0000 | Freq: Once | INTRAVENOUS | Status: AC | PRN
  Administered 2014-07-09: 1 via INTRADERMAL

## 2014-07-09 MED ORDER — MIDAZOLAM HCL 5 MG/5ML IJ SOLN
INTRAMUSCULAR | Status: DC | PRN
  Administered 2014-07-09: 2 mg via INTRAVENOUS

## 2014-07-09 MED ORDER — ONDANSETRON HCL 4 MG/2ML IJ SOLN
INTRAMUSCULAR | Status: DC | PRN
  Administered 2014-07-09 (×2): 4 mg via INTRAVENOUS

## 2014-07-09 MED ORDER — SODIUM CHLORIDE 0.9 % IV SOLN
INTRAVENOUS | Status: DC
  Filled 2014-07-09: qty 1

## 2014-07-09 MED ORDER — DEXAMETHASONE SODIUM PHOSPHATE 4 MG/ML IJ SOLN
INTRAMUSCULAR | Status: AC
  Filled 2014-07-09: qty 2

## 2014-07-09 MED ORDER — OXYCODONE HCL 5 MG PO TABS
ORAL_TABLET | ORAL | Status: AC
  Administered 2014-07-09: 10 mg via ORAL
  Filled 2014-07-09: qty 1

## 2014-07-09 MED ORDER — ONDANSETRON HCL 4 MG/2ML IJ SOLN: 4.0000 mg | Freq: Four times a day (QID) | INTRAMUSCULAR | Status: DC | PRN

## 2014-07-09 MED ORDER — CEFAZOLIN SODIUM 1-5 GM-% IV SOLN
1.0000 g | Freq: Three times a day (TID) | INTRAVENOUS | Status: AC
  Administered 2014-07-09 – 2014-07-10 (×3): 1 g via INTRAVENOUS
  Filled 2014-07-09 (×3): qty 50

## 2014-07-09 SURGICAL SUPPLY — 81 items
ALLODERM MED 2X4 (Tissue) ×3 IMPLANT
APPLIER CLIP 9.375 MED OPEN (MISCELLANEOUS) ×3
BAG DECANTER FOR FLEXI CONT (MISCELLANEOUS) ×3 IMPLANT
BINDER BREAST LRG (GAUZE/BANDAGES/DRESSINGS) ×3 IMPLANT
BINDER BREAST XLRG (GAUZE/BANDAGES/DRESSINGS) IMPLANT
CANISTER SUCTION 2500CC (MISCELLANEOUS) ×6 IMPLANT
CHLORAPREP W/TINT 26ML (MISCELLANEOUS) ×3 IMPLANT
CLIP APPLIE 9.375 MED OPEN (MISCELLANEOUS) ×1 IMPLANT
CLOSURE STERI-STRIP 1/2X4 (GAUZE/BANDAGES/DRESSINGS) ×1
CLOSURE WOUND 1/2 X4 (GAUZE/BANDAGES/DRESSINGS)
CLSR STERI-STRIP ANTIMIC 1/2X4 (GAUZE/BANDAGES/DRESSINGS) ×2 IMPLANT
CONT SPEC 4OZ CLIKSEAL STRL BL (MISCELLANEOUS) ×3 IMPLANT
COVER PROBE W GEL 5X96 (DRAPES) ×3 IMPLANT
COVER SURGICAL LIGHT HANDLE (MISCELLANEOUS) ×3 IMPLANT
DEVICE DISSECT PLASMABLAD 3.0S (MISCELLANEOUS) ×1 IMPLANT
DRAIN CHANNEL 15F RND FF W/TCR (WOUND CARE) ×6 IMPLANT
DRAIN CHANNEL 19F RND (DRAIN) IMPLANT
DRAPE LAPAROSCOPIC ABDOMINAL (DRAPES) IMPLANT
DRAPE ORTHO SPLIT 77X108 STRL (DRAPES) ×4
DRAPE PROXIMA HALF (DRAPES) ×6 IMPLANT
DRAPE SURG ORHT 6 SPLT 77X108 (DRAPES) ×2 IMPLANT
DRAPE WARM FLUID 44X44 (DRAPE) ×3 IMPLANT
DRSG PAD ABDOMINAL 8X10 ST (GAUZE/BANDAGES/DRESSINGS) ×6 IMPLANT
DRSG TEGADERM 2-3/8X2-3/4 SM (GAUZE/BANDAGES/DRESSINGS) ×3 IMPLANT
DRSG TEGADERM 4X4.75 (GAUZE/BANDAGES/DRESSINGS) ×6 IMPLANT
ELECT BLADE 4.0 EZ CLEAN MEGAD (MISCELLANEOUS) ×3
ELECT BLADE 6.5 EXT (BLADE) IMPLANT
ELECT CAUTERY BLADE 6.4 (BLADE) ×6 IMPLANT
ELECT REM PT RETURN 9FT ADLT (ELECTROSURGICAL) ×6
ELECTRODE BLDE 4.0 EZ CLN MEGD (MISCELLANEOUS) ×1 IMPLANT
ELECTRODE REM PT RTRN 9FT ADLT (ELECTROSURGICAL) ×2 IMPLANT
EVACUATOR SILICONE 100CC (DRAIN) ×6 IMPLANT
GAUZE SPONGE 4X4 12PLY STRL (GAUZE/BANDAGES/DRESSINGS) IMPLANT
GLOVE BIO SURGEON STRL SZ 6 (GLOVE) ×6 IMPLANT
GLOVE BIO SURGEON STRL SZ 6.5 (GLOVE) ×6 IMPLANT
GLOVE BIO SURGEON STRL SZ7 (GLOVE) ×9 IMPLANT
GLOVE BIO SURGEON STRL SZ7.5 (GLOVE) ×3 IMPLANT
GLOVE BIO SURGEONS STRL SZ 6.5 (GLOVE) ×3
GLOVE BIOGEL PI IND STRL 6.5 (GLOVE) ×4 IMPLANT
GLOVE BIOGEL PI IND STRL 7.0 (GLOVE) ×2 IMPLANT
GLOVE BIOGEL PI IND STRL 7.5 (GLOVE) ×2 IMPLANT
GLOVE BIOGEL PI INDICATOR 6.5 (GLOVE) ×8
GLOVE BIOGEL PI INDICATOR 7.0 (GLOVE) ×4
GLOVE BIOGEL PI INDICATOR 7.5 (GLOVE) ×4
GOWN STRL REUS W/ TWL LRG LVL3 (GOWN DISPOSABLE) ×6 IMPLANT
GOWN STRL REUS W/TWL LRG LVL3 (GOWN DISPOSABLE) ×12
ILLUMINATOR WAVEGUIDE N/F (MISCELLANEOUS) ×3 IMPLANT
IMPLANT BREAST ARTOURA 225CC (Breast) ×3 IMPLANT
KIT BASIN OR (CUSTOM PROCEDURE TRAY) ×3 IMPLANT
KIT ROOM TURNOVER OR (KITS) ×3 IMPLANT
LIQUID BAND (GAUZE/BANDAGES/DRESSINGS) ×3 IMPLANT
MARKER SKIN DUAL TIP RULER LAB (MISCELLANEOUS) ×3 IMPLANT
NEEDLE 18GX1X1/2 (RX/OR ONLY) (NEEDLE) IMPLANT
NEEDLE HYPO 25GX1X1/2 BEV (NEEDLE) IMPLANT
NS IRRIG 1000ML POUR BTL (IV SOLUTION) ×6 IMPLANT
PACK GENERAL/GYN (CUSTOM PROCEDURE TRAY) ×3 IMPLANT
PAD ARMBOARD 7.5X6 YLW CONV (MISCELLANEOUS) ×3 IMPLANT
PIN SAFETY STERILE (MISCELLANEOUS) ×3 IMPLANT
PLASMABLADE 3.0S (MISCELLANEOUS) ×3
SET ASEPTIC TRANSFER (MISCELLANEOUS) ×3 IMPLANT
SOLUTION BETADINE 4OZ (MISCELLANEOUS) ×3 IMPLANT
SPECIMEN JAR LARGE (MISCELLANEOUS) ×3 IMPLANT
SPECIMEN JAR X LARGE (MISCELLANEOUS) IMPLANT
SPONGE LAP 18X18 X RAY DECT (DISPOSABLE) ×3 IMPLANT
STAPLER VISISTAT 35W (STAPLE) IMPLANT
STRIP CLOSURE SKIN 1/2X4 (GAUZE/BANDAGES/DRESSINGS) IMPLANT
SUT ETHILON 2 0 FS 18 (SUTURE) ×6 IMPLANT
SUT MNCRL AB 4-0 PS2 18 (SUTURE) ×9 IMPLANT
SUT SILK 2 0 SH (SUTURE) IMPLANT
SUT VIC AB 2-0 SH 27 (SUTURE) ×2
SUT VIC AB 2-0 SH 27X BRD (SUTURE) ×1 IMPLANT
SUT VIC AB 3-0 54X BRD REEL (SUTURE) IMPLANT
SUT VIC AB 3-0 BRD 54 (SUTURE)
SUT VIC AB 3-0 SH 18 (SUTURE) ×3 IMPLANT
SUT VIC AB 3-0 SH 27 (SUTURE) ×4
SUT VIC AB 3-0 SH 27X BRD (SUTURE) ×2 IMPLANT
SUT VICRYL 4-0 PS2 18IN ABS (SUTURE) ×3 IMPLANT
SYR CONTROL 10ML LL (SYRINGE) IMPLANT
TOWEL OR 17X24 6PK STRL BLUE (TOWEL DISPOSABLE) ×3 IMPLANT
TOWEL OR 17X26 10 PK STRL BLUE (TOWEL DISPOSABLE) ×3 IMPLANT
TRAY FOLEY CATH 16FRSI W/METER (SET/KITS/TRAYS/PACK) IMPLANT

## 2014-07-09 NOTE — Interval H&P Note (Signed)
History and Physical Interval Note:  07/09/2014 6:57 AM  [facility]ara [facility]  has presented today for surgery, with the diagnosis of RIGHT BREAST DCIS  The various methods of treatment have been discussed with the patient and family. After consideration of risks, benefits and other options for treatment, the patient has consented to  Right breast reconstruction with tissue expander, acellular dermis as a surgical intervention .  The patient's history has been reviewed, patient examined, no change in status, stable for surgery.  I have reviewed the patient's chart and labs.  Questions were answered to the patient's satisfaction.     Sharnise Blough

## 2014-07-09 NOTE — Anesthesia Procedure Notes (Addendum)
Procedure Name: Intubation Date/Time: 07/09/2014 7:55 AM Performed by: Kyung Rudd Pre-anesthesia Checklist: Patient identified, Emergency Drugs available, Suction available, Patient being monitored and Timeout performed Patient Re-evaluated:Patient Re-evaluated prior to inductionOxygen Delivery Method: Circle system utilized Preoxygenation: Pre-oxygenation with 100% oxygen Intubation Type: IV induction Ventilation: Mask ventilation without difficulty Laryngoscope Size: Mac and 3 Grade View: Grade I Tube type: Oral Tube size: 7.5 mm Number of attempts: 1 Airway Equipment and Method: Stylet Placement Confirmation: ETT inserted through vocal cords under direct vision,  positive ETCO2 and breath sounds checked- equal and bilateral Secured at: 22 cm Tube secured with: Tape Dental Injury: Teeth and Oropharynx as per pre-operative assessment  Comments: AOI per Tressia Miners, SRNA with MDA supervision.   Anesthesia Regional Block:  Pectoralis block  Pre-Anesthetic Checklist: ,, timeout performed, Correct Patient, Correct Site, Correct Laterality, Correct Procedure, Correct Position, site marked, Risks and benefits discussed,  Surgical consent,  Pre-op evaluation,  At surgeon's request and post-op pain management  Laterality: Right  Prep: chloraprep       Needles:  Injection technique: Single-shot  Needle Type: Echogenic Stimulator Needle      Needle Gauge: 22 and 22 G    Additional Needles:  Procedures: ultrasound guided (picture in chart) Pectoralis block Narrative:  Start time: 07/09/2014 7:20 AM End time: 07/09/2014 7:25 AM Injection made incrementally with aspirations every 5 mL.  Performed by: Personally   Additional Notes: 30 cc 0.50 bupivacaine with 1:200 epi injected easily

## 2014-07-09 NOTE — Op Note (Signed)
Preoperative diagnosis: right breast dcis, clinical stage 0 Postoperative diagnosis: same as above Procedure: right nipple sparing mastectomy, right axillary sentinel node biopsy Surgeon: Dr Serita Grammes Asst: Dr Irene Limbo EBL: minimal Anesthesia: general with pectoral block Specimens: right breast tissue marked short superior, long lateral double is nac Right nipple margin Right axillary sentinel nodes with highest count 8341 Complications none Drains per plastic surgery Case turned over to plastic surgery upon completion  Indications: this is a 41 yof who presents with extensive appearing right breast dcis.  We have discussed all options, genetic testing is negative and have elected to undergo right nsm with axillary sn biopsy.  Procedure: After informed consent was obtained the patient was taken to the operating room. Antibiotics were given. SCDs were in place.  A pectoral block was completed.  Technetium was administered in the standard periareolar fashion.  She was placed under general anesthesia without complication.  She was prepped and draped in the standard sterile surgical fashion. Timeout was performed.  I made a 9 cm inframammary incision about 8 cm from sternum. I then created a posterior flap with cautery removing the fascia from the pectoralis muscle. This was done to the parasternal region, clavicle and latissimus laterally.  I then created the anterior flap using the plasmablade.  The breast was then removed in its entirety.  The skin and nac were all viable. The breast was marked as above. I removed the nipple margin as a separate specimen.  Hemostasis was obtained.  I attempted to do the sentinel node biopsy from this incision but was difficult and I was concerned about the skin. I made a separate axillary incision and identified a bundle of sentinel nodes. These were removed.  There was no real background radioactivity.  Hemostasis was obtained.  I then closed the  fascia with 2-0 vicryl.  Skin was closed with 3-0 vicryl and 4-0 monocryl. Case was turned over to plastic surgery for completion.

## 2014-07-09 NOTE — Anesthesia Postprocedure Evaluation (Signed)
  Anesthesia Post-op Note  Patient: Tara Fuller  Procedure(s) Performed: Procedure(s): RIGHT NIPPLE SPARING MASTECTOMY WITH RIGHT AXILLARY  SENTINAL LYMPH NODE BIOPSY (Right) RIGHT BREAST RECONSTRUCTION WITH PLACEMENT OF TISSUE EXPANDER AND ALLODERM (Right)  Patient Location: PACU  Anesthesia Type:General and GA combined with regional for post-op pain  Level of Consciousness: awake, alert  and oriented  Airway and Oxygen Therapy: Patient Spontanous Breathing and Patient connected to nasal cannula oxygen  Post-op Pain: mild  Post-op Assessment: Post-op Vital signs reviewed, Patient's Cardiovascular Status Stable, Respiratory Function Stable, Patent Airway and Pain level controlled              Post-op Vital Signs: stable  Last Vitals:  Filed Vitals:   07/09/14 1230  BP: 118/79  Pulse: 103  Temp: 36.8 C  Resp: 20    Complications: No apparent anesthesia complications

## 2014-07-09 NOTE — Transfer of Care (Signed)
Immediate Anesthesia Transfer of Care Note  Patient: Tara Fuller  Procedure(s) Performed: Procedure(s): RIGHT NIPPLE SPARING MASTECTOMY WITH RIGHT AXILLARY  SENTINAL LYMPH NODE BIOPSY (Right) RIGHT BREAST RECONSTRUCTION WITH PLACEMENT OF TISSUE EXPANDER AND ALLODERM (Right)  Patient Location: PACU  Anesthesia Type:General  Level of Consciousness: awake, alert  and oriented  Airway & Oxygen Therapy: Patient Spontanous Breathing and Patient connected to nasal cannula oxygen  Post-op Assessment: Report given to RN, Post -op Vital signs reviewed and stable and Patient moving all extremities  Post vital signs: Reviewed and stable  Last Vitals:  Filed Vitals:   07/09/14 0620  BP: 124/75  Pulse: 101  Temp: 36.8 C  Resp: 18    Complications: No apparent anesthesia complications

## 2014-07-09 NOTE — Op Note (Signed)
Operative Note   DATE OF OPERATION: 7.5.16  LOCATION: [facility] OR- observation  SURGICAL DIVISION: Plastic Surgery  PREOPERATIVE DIAGNOSES:  1. Right breast DCIS  POSTOPERATIVE DIAGNOSES:  same  PROCEDURE:  1. Right breast reconstruction with tissue expander 2. Acellular dermis (Alloderm) for breast reconstruction 8 cm2  SURGEON: Irene Limbo MD MBA  ASSISTANT: none  ANESTHESIA:  General.   EBL: 150 ml entire case  COMPLICATIONS: None immediate.   INDICATIONS FOR PROCEDURE:  The patient, Tara Fuller, is a 39 y.o. female born on [birth year], is here for immediate reconstruction following nipple sparing mastectomy, sentinel node.   FINDINGS: Mentor Artoura High Profile  225 ml tissue expander, initial fill volume 95 ml. Ref [id number]  DESCRIPTION OF PROCEDURE:  The patient was marked in the preoperative area including breast meridians, sternal notch, chest midline, anterior axillary lines and inframammary folds. The patient was taken to the operating room. SCDs were placed and IV antibiotics were given. The patient's operative site was prepped and draped in a sterile fashion. A time out was performed and all information was confirmed to be correct. Following completion of mastectomy, the cavity was irrigated with saline and hemostasis obtained. The inferior insertion of pectoralis muscle corresponded with the inframammary fold of the breast. The inferior insertions of pectoralis major muscle were divided and submuscular dissection completed. Toward origin of the pectoralis muscle, the muscle was elevated continuous with serratus fascia and serratus muscle. A 15 Fr drain was placed in subcutaneous position beneath mastectomy flap and secured to skin with 2-0 nylon. A second 15 Fr JP was placed in submuscular position.The cavity was irrigated with solution containing Ancef, genatmicin, and bacitracin. The tissue expander was prepared and placed in submuscular position. The  expander was secured to chest wall with a 3-0 vicryl. The pectoralis muscle was secured to serratus fascia and serratus muscle or rectus fascia with interrupted 3-0 vicryl medially and inferiorly. At the lateral border of pectoralis, acellular dermis was utilized to provide coverage of expander. The Alloderm was prepared and perforated and sewn to the lateral border pectoralis and to serratus muscle. iThe incision was closed with 3-0 vicryl in fascial layer and 4-0 vicryl in dermis. Skin closure completed with 4-0 monocryl subcuticular and tissue adhesive.The port was accessed and filled to 95 ml. The patient was brought to upright position and the skin flaps were redraped so that NAC was symmetric from midline. Transparent, adherent dressings applied.   The patient was allowed to wake from anesthesia, extubated and taken to the recovery room in satisfactory condition.   SPECIMENS: none  DRAINS: 15 Fr JP in right reconstructed breast, subcutaneous and submuscular  Irene Limbo, MD [facility] Plastic & Reconstructive Surgery [phone]

## 2014-07-09 NOTE — Anesthesia Preprocedure Evaluation (Addendum)
Anesthesia Evaluation  Patient identified by MRN, date of birth, ID band Patient awake    Reviewed: Allergy & Precautions, NPO status , Patient's Chart, lab work & pertinent test results  Airway Mallampati: I       Dental  (+) Teeth Intact, Dental Advisory Given   Pulmonary  breath sounds clear to auscultation  Pulmonary exam normal       Cardiovascular Normal cardiovascular examRhythm:Regular Rate:Normal     Neuro/Psych  Headaches,    GI/Hepatic   Endo/Other    Renal/GU      Musculoskeletal   Abdominal   Peds  Hematology   Anesthesia Other Findings Breast CA  Reproductive/Obstetrics                           Anesthesia Physical Anesthesia Plan  ASA: I  Anesthesia Plan: General   Post-op Pain Management:    Induction:   Airway Management Planned: Oral ETT  Additional Equipment:   Intra-op Plan:   Post-operative Plan: Extubation in OR  Informed Consent: I have reviewed the patients History and Physical, chart, labs and discussed the procedure including the risks, benefits and alternatives for the proposed anesthesia with the patient or authorized representative who has indicated his/her understanding and acceptance.   Dental advisory given  Plan Discussed with: CRNA, Anesthesiologist and Surgeon  Anesthesia Plan Comments:        Anesthesia Quick Evaluation

## 2014-07-09 NOTE — H&P (Signed)
  39 yo healthy female who presents after being followed with mm (has family history) for right breast calcifications. One of these areas that measured about 1.5 cm appeared different and underwent stereo biopsy recently. This is low to int grade dcis that is er/pr positive. she has no prior breast history. she felt no mass and has no discharge. on the most recent films there is an additional 5 cm of calcifications going anterior to the calcs that were biopsied. her mri showed only really a hematoma and genetic testing has been negative. she has been seen by plastic surgery.  Other Problems Rolm Bookbinder, MD; 06/15/2014 10:15 AM) Breast Cancer  Past Surgical History Rolm Bookbinder, MD; 06/15/2014 10:15 AM) Breast Biopsy Right. Cesarean Section - 1 Oral Surgery  Allergies Shirlean Mylar Gwynn, RMA; 06/14/2014 1:55 PM) No Known Drug Allergies06/10/2014  Medication History Shirlean Mylar Gwynn, RMA; 06/14/2014 1:57 PM) birth control Active. Multiple Vitamin (1 (one) Oral daily) Active. Medications Reconciled  Social History Rolm Bookbinder, MD; 06/15/2014 10:15 AM) Caffeine use Carbonated beverages, Tea. No alcohol use No drug use Tobacco use Never smoker.  Family History Rolm Bookbinder, MD; 06/15/2014 10:16 AM) Breast Cancer Family Members In General. Diabetes Mellitus Family Members In General. Hypertension Father.  Vitals (Robin Gwynn RMA; 06/14/2014 1:58 PM) 06/14/2014 1:57 PM Weight: 110 lb Height: 63in Body Surface Area: 1.49 m Body Mass Index: 19.49 kg/m Temp.: 97.102F  Pulse: 88 (Regular)  BP: 140/90 (Sitting, Left Arm, Standard)    Physical Exam Rolm Bookbinder MD; 06/15/2014 10:04 AM) General Mental Status-Alert. Orientation-Oriented X3.  Chest and Lung Exam Chest and lung exam reveals -on auscultation, normal breath sounds, no adventitious sounds and normal vocal resonance. Breast Nipples-No Discharge. Breast Lump-No Palpable  Breast Mass. Note: large inferior right breast hematoma Cardiovascular Cardiovascular examination reveals -normal heart sounds, regular rate and rhythm with no murmurs. Lymphatic Head & Neck General Head & Neck Lymphatics: Bilateral - Description - Normal. Axillary General Axillary Region: Bilateral - Description - Normal. 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 We discussed the options for treatment of the breast cancer which included lumpectomy versus a mastectomy. I dont think she is candidate for lumpectomy. We discussed mastectomy (removal of whole breast) and the postoperative care for that as well. Mastectomy can be followed by reconstruction. I do think she is candidate for nsm via im incision. Will plan on nsm with immediate reconstruction. discussed loss of nac, loss of sensation, removal if cancer involved with it (this is close but I think reasonable to try), flap loss, possibility of invasive cancer on final specimen necessitating change in therapy.

## 2014-07-10 ENCOUNTER — Encounter (HOSPITAL_COMMUNITY): Payer: Self-pay | Admitting: General Surgery

## 2014-07-10 DIAGNOSIS — D0511 Intraductal carcinoma in situ of right breast: Secondary | ICD-10-CM | POA: Diagnosis not present

## 2014-07-10 MED ORDER — KETOROLAC TROMETHAMINE 30 MG/ML IJ SOLN
30.0000 mg | Freq: Once | INTRAMUSCULAR | Status: AC
  Administered 2014-07-10: 30 mg via INTRAVENOUS
  Filled 2014-07-10: qty 1

## 2014-07-10 MED ORDER — SULFAMETHOXAZOLE-TRIMETHOPRIM 800-160 MG PO TABS: 1.0000 | ORAL_TABLET | Freq: Two times a day (BID) | ORAL | Status: DC

## 2014-07-10 MED ORDER — DIAZEPAM 2 MG PO TABS: 2.0000 mg | ORAL_TABLET | Freq: Three times a day (TID) | ORAL | Status: DC | PRN

## 2014-07-10 MED ORDER — OXYCODONE HCL 5 MG PO TABS: 5.0000 mg | ORAL_TABLET | ORAL | Status: DC | PRN

## 2014-07-10 NOTE — Progress Notes (Signed)
POD# 1 R NSM, SLN, TE/ADM reconstruction  Temp:  [97.7 F (36.5 C)-99.2 F (37.3 C)] 97.9 F (36.6 C) (07/06 0646) Pulse Rate:  [78-116] 80 (07/06 0646) Resp:  [13-21] 18 (07/06 0646) BP: (112-142)/(57-88) 114/63 mmHg (07/06 0646) SpO2:  [99 %-100 %] 99 % (07/06 0646)   PO 360 JP 110/220  PE Chest soft expected edema Flaps viable Drains serosanguinous   A/P Ambulate Drain teaching including stripping Instructed to add motrin ATC at home Home later today  Irene Limbo, MD [facility] Plastic & Reconstructive Surgery [phone]

## 2014-07-10 NOTE — Progress Notes (Signed)
DC home with husband, verbally understood DC instructions, supplies and instruction on care of drains given to both. No questions asked

## 2014-07-10 NOTE — Discharge Summary (Signed)
Physician Discharge Summary  [name] MRN: [id number] DOB/AGE: 05-Mar-1975 39 y.o.  Admit date: 07/09/2014 Discharge date: 07/10/2014  Admission Diagnoses: Right breast dcis  Discharge Diagnoses:  Active Problems:   DCIS (ductal carcinoma in situ)   Discharged Condition: good  Hospital Course: 15 yof with right breast dcis was admitted and underwent right nipple sparing mastectomy with right axillary sentinel node biopsy. This was followed by tissue expander reconstruction. She did well overnight with expected drain output. Pain is under fair control.  She is tolerating her diet. She will be discharged home with follow up.   Consults: None  Significant Diagnostic Studies: none  Treatments: surgery: right nsm with right axillary sn biopsy, te reconstruction  Discharge Exam: Blood pressure 114/63, pulse 80, temperature 97.9 F (36.6 C), temperature source Oral, resp. rate 18, height 5\' 3"  (1.6 m), weight 49.442 kg (109 lb), SpO2 99 %. flaps and nac viable, drains with expected output  Disposition:home  Discharge Instructions    Call MD for:  redness, tenderness, or signs of infection (pain, swelling, bleeding, redness, odor or green/yellow discharge around incision site)    Complete by:  As directed      Call MD for:  severe or increased pain, loss or decreased feeling  in affected limb(s)    Complete by:  As directed      Discharge instructions    Complete by:  As directed   Ok to remove binder and dressing and shower am 07/11/14. Pat tegaderms (clear plastic dressing) dry. Dry dressing as desired. Breast binder or soft compression bra all other times  Strip and record drains twice daily and bring log to clinic.   It is ok to lift arm above head for bathing, dressing.  No strenuous activity or exercise or housework     Driving Restrictions    Complete by:  As directed   No driving while taking narcotics     Lifting restrictions    Complete by:  As directed   No lifting greater than 5 lbs     Resume previous diet    Complete by:  As directed             Medication List    ASK your doctor about these medications        ONE DAILY MULTIVITAMIN WOMEN PO  Take by mouth daily.           Follow-up Information    Follow up with [facility], Arnoldo Hooker, MD In 1 week.   Specialty:  Plastic Surgery   Why:  as scheduled   Contact information:   [place] [address] [phone]       Follow up with Rolm Bookbinder, MD. Schedule an appointment as soon as possible for a visit in 2 weeks.   Specialty:  General Surgery   Contact information:   [address] [place] [address] [phone]       Signed: Rolm Bookbinder 07/10/2014, 7:28 AM

## 2014-07-19 ENCOUNTER — Telehealth: Payer: Self-pay

## 2014-07-19 NOTE — Telephone Encounter (Signed)
Order for bras faxed to [employer] [employer].  Sent to scan.  

## 2014-07-23 NOTE — Assessment & Plan Note (Signed)
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 Breast MRI revealed 3.8 x 2.4 x 1.7 cm postbiopsy hematoma right breast centered at 6:00 position  Pathology radiology review: I discussed with the patient the difference between invasive cancer and DCIS. We discussed the significance of ER and PR receptors and the implications for treatment decisions. With surgery alone based on [facility], the risk of recurrence over the next 10 years was a 12%. With antiestrogen therapy the risk is reduced to 3%.  Recommendation: 1. Right mastectomy followed by 2. Antiestrogen therapy with tamoxifen 20 mg daily 5 years  Tamoxifen Toxicities:  RTC in 6 months

## 2014-07-24 ENCOUNTER — Encounter: Payer: Self-pay | Admitting: Hematology and Oncology

## 2014-07-24 ENCOUNTER — Telehealth: Payer: Self-pay | Admitting: Hematology and Oncology

## 2014-07-24 ENCOUNTER — Ambulatory Visit: Payer: No Typology Code available for payment source | Attending: General Surgery | Admitting: Physical Therapy

## 2014-07-24 ENCOUNTER — Ambulatory Visit (HOSPITAL_BASED_OUTPATIENT_CLINIC_OR_DEPARTMENT_OTHER): Payer: No Typology Code available for payment source | Admitting: Hematology and Oncology

## 2014-07-24 ENCOUNTER — Encounter: Payer: Self-pay | Admitting: Physical Therapy

## 2014-07-24 VITALS — BP 134/83 | HR 80 | Temp 97.6°F | Resp 18 | Ht 63.0 in | Wt 108.1 lb

## 2014-07-24 DIAGNOSIS — M25611 Stiffness of right shoulder, not elsewhere classified: Secondary | ICD-10-CM | POA: Diagnosis present

## 2014-07-24 DIAGNOSIS — D0511 Intraductal carcinoma in situ of right breast: Secondary | ICD-10-CM

## 2014-07-24 DIAGNOSIS — Z9011 Acquired absence of right breast and nipple: Secondary | ICD-10-CM | POA: Diagnosis present

## 2014-07-24 DIAGNOSIS — Z170 Estrogen receptor positive status [ER+]: Secondary | ICD-10-CM | POA: Diagnosis not present

## 2014-07-24 DIAGNOSIS — C50911 Malignant neoplasm of unspecified site of right female breast: Secondary | ICD-10-CM

## 2014-07-24 MED ORDER — TAMOXIFEN CITRATE 20 MG PO TABS: 20.0000 mg | ORAL_TABLET | Freq: Every day | ORAL | Status: DC

## 2014-07-24 NOTE — Therapy (Signed)
[place], [place], [address] Phone: [phone]   Fax:  [phone]  Physical Therapy Evaluation  Patient Details  Name: Tara Fuller MRN: [id number] Date of Birth: [birth year] Referring Provider:  Rolm Bookbinder, MD  Encounter Date: 07/24/2014      PT End of Session - 07/24/14 1305    Visit Number 1   Number of Visits 8   Date for PT Re-Evaluation 08/21/14   PT Start Time 1140   PT Stop Time 1238   PT Time Calculation (min) 58 min   Activity Tolerance Patient tolerated treatment well   Behavior During Therapy [facility] for tasks assessed/performed      Past Medical History  Diagnosis Date  . 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 Spinal headache 08    c section with spinal -patch  . Heart murmur     ? teen    Past Surgical History  Procedure Laterality Date  . Cesarean section  2008  . Mastectomy w/ sentinel node biopsy  07/09/2014    NIPPLE SPARING  WITH RECONSTRUCTION  . Wisdom tooth extraction    . Nipple sparing mastectomy/sentinal lymph node biopsy/reconstruction/placement of tissue expander Right 07/09/2014    Procedure: RIGHT NIPPLE SPARING MASTECTOMY WITH RIGHT AXILLARY  SENTINAL LYMPH NODE BIOPSY;  Surgeon: Rolm Bookbinder, MD;  Location: [place];  Service: General;  Laterality: Right;  . Breast reconstruction with placement of tissue expander and flex hd (acellular hydrated dermis) Right 07/09/2014    Procedure: RIGHT BREAST RECONSTRUCTION WITH PLACEMENT OF TISSUE EXPANDER AND ALLODERM;  Surgeon: Irene Limbo, MD;  Location: [place];  Service: Plastics;  Laterality: Right;    There were no vitals filed for this visit.  Visit Diagnosis:  Shoulder stiffness, right - Plan: PT plan of care cert/re-cert  H/O right mastectomy - Plan: PT plan of care cert/re-cert      Subjective Assessment - 07/24/14 1140    Subjective Had right mastectomy with sentinel node biopsy (5  nodes) on 07/09/14.  She will start Tamoxifen 08/05/14 but does not need chemo or radiation.  She is having trouble moving her right arm.   Patient is accompained by: Family member   Pertinent History Had right mastectomy with sentinel node biopsy (5 nodes) on 07/09/14.  She will start Tamoxifen 08/05/14 but does not need chemo or radiation.  She is having trouble moving her right arm.   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 Yes   Pain Score 4    Pain Location Axilla   Pain Orientation Right   Pain Descriptors / Indicators Aching   Pain Type Surgical pain   Pain Onset 1 to 4 weeks ago   Pain Frequency Intermittent   Aggravating Factors  Moving arm   Pain Relieving Factors rest   Multiple Pain Sites No            [facility] PT Assessment - 07/24/14 0001    Assessment   Medical Diagnosis s/p right mastectomy withr econstruction   Onset Date/Surgical Date 07/09/14   Hand Dominance Right   Next MD Visit today   Prior Therapy none   Precautions   Precautions Other (comment)  Hx right breast cancer   Restrictions   Weight Bearing Restrictions No   Other Position/Activity Restrictions n   Balance Screen   Has the patient fallen in  the past 6 months No   Has the patient had a decrease in activity level because of a fear of falling?  No   Is the patient reluctant to leave their home because of a fear of falling?  No   Home Social worker Private residence   Living Arrangements Spouse/significant other;Children  Husband, 37 and 70 y.o. sons   Available Help at Discharge Family   Prior Function   Level of Independence Independent   Vocation Unemployed   Leisure Exercises 2-3x/wk with weights and cardio   Cognition   Overall Cognitive Status Within Functional Limits for tasks assessed   Posture/Postural Control   Posture/Postural Control No significant limitations   ROM  / Strength   AROM / PROM / Strength AROM;Strength   AROM   AROM Assessment Site Shoulder   Right/Left Shoulder Right;Left   Right Shoulder Extension 54 Degrees   Right Shoulder Flexion 85 Degrees   Right Shoulder ABduction 92 Degrees   Right Shoulder Internal Rotation 75 Degrees   Right Shoulder External Rotation 79 Degrees   Left Shoulder Extension 75 Degrees   Left Shoulder Flexion 150 Degrees   Left Shoulder ABduction 151 Degrees   Left Shoulder Internal Rotation 68 Degrees   Left Shoulder External Rotation 79 Degrees   Strength   Overall Strength Due to precautions;Unable to assess   Palpation   Palpation comment Right breast and axillary incision sites appear to be healing well           LYMPHEDEMA/ONCOLOGY QUESTIONNAIRE - 07/24/14 1208    Type   Cancer Type Right breast DCIS   Surgeries   Mastectomy Date 07/09/14   Saline Implant Reconstruction Date 07/09/14  expander placed   Sentinel Lymph Node Biopsy Date 07/09/14   Number Lymph Nodes Removed 5   Treatment   Active Chemotherapy Treatment No   Past Chemotherapy Treatment No   Active Radiation Treatment No   Past Radiation Treatment No   Current Hormone Treatment No  begins 08/05/14   What other symptoms do you have   Are you Having Heaviness or Tightness No   Are you having Pain Yes   Are you having pitting edema No   Is it Hard or Difficult finding clothes that fit No   Do you have infections No   Is there Decreased scar mobility Yes   Stemmer Sign No   Lymphedema Assessments   Lymphedema Assessments Upper extremities   Right Upper Extremity Lymphedema   10 cm Proximal to Olecranon Process 23.2 cm   Olecranon Process 20.5 cm   10 cm Proximal to Ulnar Styloid Process 18.8 cm   Just Proximal to Ulnar Styloid Process 14 cm   Across Hand at [employer] 17.8 cm   At [place] of 2nd Digit 5.5 cm   Left Upper Extremity Lymphedema   10 cm Proximal to Olecranon Process 23.4 cm   Olecranon Process 21.3 cm    10 cm Proximal to Ulnar Styloid Process 18.4 cm   Just Proximal to Ulnar Styloid Process 14 cm   Across Hand at [employer] 17 cm   At [place] of 2nd Digit 5.4 cm                        PT Education - 07/24/14 1300    Education provided Yes   Education Details Shoulder ROM HEP and info for ABC class   Person(s) Educated Patient;Spouse   Methods  Explanation;Demonstration;Handout   Comprehension Verbalized understanding;Returned demonstration                [facility] Goals - 07/24/14 1311    CC Long Term Goal  #1   Title Patient will be able to perform home exercise program correctly and independently to increase ROM.   CC Long Term Goal  #2   Title Patient will be able to increase right shoulder active flexion to >/= 130 degrees for increased ease reaching overhead   Time 4   Period Weeks   Status New   CC Long Term Goal  #3   Title Patient will be able to increase right shoulder active abduction to >/= 130 degrees for increased ease reaching overhead   Time 4   Period Weeks   Status New   CC Long Term Goal  #4   Title Patient will report she is able to dry her hair with >/= 50% less difficulty   Time 4   Period Weeks   Status New   CC Long Term Goal  #5   Title Patient will be able to return to her gym doing gentle exercise and will verbalize understanding of how to progress to prior level of exercise   Time 4   Period Weeks   Status New            Plan - 07/24/14 1305    Clinical Impression Statement Patient underwent a right nipple sparing mastectomy with an expander placed fr reconstruction on 07/09/14 for right DCIS breast cancer.  She still has 1 drain in but the plan is for it to be removed next week.  She has very limited right shoudler ROM which is to be expected.  She will benefit from physical therapy for improving ROM and strength.  It has also been recommended she attend the After Breast Cancer Class.   Pt will benefit from  skilled therapeutic intervention in order to improve on the following deficits Decreased range of motion;Decreased knowledge of precautions;Pain;Decreased mobility;Impaired UE functional use   Rehab Potential Excellent   Clinical Impairments Affecting Rehab Potential None   PT Frequency One time visit   PT Treatment/Interventions Patient/family education;Therapeutic exercise;Manual techniques;Passive range of motion;ADLs/Self Care Home Management   PT Next Visit Plan PROM, pulleys, ball up wall, ROM exericises, review HEP   PT Home Exercise Plan Shoulder ROM HEP   Consulted and Agree with Plan of Care Patient;Family member/caregiver   Family Member Consulted Husband         Problem List Patient Active Problem List   Diagnosis Date Noted  . 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 Genetic testing 07/03/2014  . Family history of breast cancer 07/03/2014  . Family history of cancer 07/03/2014  . Breast cancer, right breast 06/19/2014    Annia Friendly, PT 07/24/2014, 1:17 PM  [place], [place], [address] Phone: [phone]   Fax:  [phone]

## 2014-07-24 NOTE — Progress Notes (Signed)
Patient Care Team: Karlene Einstein, MD as PCP - General (Family Medicine)  DIAGNOSIS: No matching staging information was found for the patient.  SUMMARY OF ONCOLOGIC HISTORY:   Breast cancer, right breast   06/07/2014 Initial Diagnosis Right breast biopsy: Lower Outer quadrant: DCIS with calcifications, low to intermediate grade, ER 90%, PR 30%   06/18/2014 Breast MRI Right breast: 3.8 cm post biopsy hematoma centered in the 6 o'clock position of the right breast, posteriorly   07/09/2014 Surgery Right mastectomy: DCIS with calcifications intermediate grade 0.6 cm 0/5 lymph nodes negative    CHIEF COMPLIANT: Follow-up after surgery  INTERVAL HISTORY: Tara Fuller is a 39 year old with above-mentioned history of right breast DCIS underwent mastectomy and reconstruction and is here today to discuss adjuvant treatment plan. She has still a drain in place. She is still sore under the arm. She denies any fevers or chills. The breast itself appears to be doing well. She is having her expander inflated by Dr. Leland Johns.  REVIEW OF SYSTEMS:   Constitutional: Denies fevers, chills or abnormal weight loss Eyes: Denies blurriness of vision Ears, nose, mouth, throat, and face: Denies mucositis or sore throat Respiratory: Denies cough, dyspnea or wheezes Cardiovascular: Denies palpitation, chest discomfort or lower extremity swelling Gastrointestinal:  Denies nausea, heartburn or change in bowel habits Skin: Denies abnormal skin rashes Lymphatics: Denies new lymphadenopathy or easy bruising Neurological:Denies numbness, tingling or new weaknesses Behavioral/Psych: Mood is stable, no new changes  Breast: Soreness in the axilla from recent surgery All other systems were reviewed with the patient and are negative.  I have reviewed the past medical history, past surgical history, social history and family history with the patient and they are unchanged from previous note.  ALLERGIES:  has No Known  Allergies.  MEDICATIONS:  Current Outpatient Prescriptions  Medication Sig Dispense Refill  . diazepam (VALIUM) 2 MG tablet Take 1 tablet (2 mg total) by mouth every 8 (eight) hours as needed for anxiety or muscle spasms. 30 tablet 0  . Multiple Vitamins-Minerals (ONE DAILY MULTIVITAMIN WOMEN PO) Take by mouth daily.     No current facility-administered medications for this visit.    PHYSICAL EXAMINATION: ECOG PERFORMANCE STATUS: 1 - Symptomatic but completely ambulatory  Filed Vitals:   07/24/14 1112  BP: 134/83  Pulse: 80  Temp: 97.6 F (36.4 C)  Resp: 18   Filed Weights   07/24/14 1112  Weight: 108 lb 1.6 oz (49.034 kg)    GENERAL:alert, no distress and comfortable SKIN: skin color, texture, turgor are normal, no rashes or significant lesions EYES: normal, Conjunctiva are pink and non-injected, sclera clear OROPHARYNX:no exudate, no erythema and lips, buccal mucosa, and tongue normal  NECK: supple, thyroid normal size, non-tender, without nodularity LYMPH:  no palpable lymphadenopathy in the cervical, axillary or inguinal LUNGS: clear to auscultation and percussion with normal breathing effort HEART: regular rate & rhythm and no murmurs and no lower extremity edema ABDOMEN:abdomen soft, non-tender and normal bowel sounds Musculoskeletal:no cyanosis of digits and no clubbing  NEURO: alert & oriented x 3 with fluent speech, no focal motor/sensory deficits  LABORATORY DATA:  I have reviewed the data as listed   Chemistry      Component Value Date/Time   NA 139 07/05/2014 1520   K 4.3 07/05/2014 1520   CL 105 07/05/2014 1520   CO2 27 07/05/2014 1520   BUN 13 07/05/2014 1520   CREATININE 0.83 07/05/2014 1520      Component Value Date/Time   CALCIUM  9.2 07/05/2014 1520       Lab Results  Component Value Date   WBC 7.1 07/05/2014   HGB 14.1 07/05/2014   HCT 41.1 07/05/2014   MCV 88.2 07/05/2014   PLT 164 07/05/2014   NEUTROABS 4.6 07/05/2014     ASSESSMENT & PLAN:  Breast cancer, right breast 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 Breast MRI revealed 3.8 x 2.4 x 1.7 cm postbiopsy hematoma right breast centered at 6:00 position Right mastectomy 07/09/2014: DCIS with calcifications intermediate grade 0.6 cm 0/5 lymph nodes negative  Pathology radiology review: I discussed the pathology report in great detail and provided her with a copy of this report. With surgery alone based on [facility], the risk of recurrence over the next 10 years was a 12%. With antiestrogen therapy the risk is reduced to 3%.  Recommendation:  Antiestrogen therapy with tamoxifen 20 mg daily 5 years. Patient will start after her vacation in August 2016  RTC in mid-September to evaluate for tamoxifen toxicities.  No orders of the defined types were placed in this encounter.   The patient has a good understanding of the overall plan. she agrees with it. she will call with any problems that may develop before the next visit here.   Rulon Eisenmenger, MD

## 2014-07-24 NOTE — Telephone Encounter (Signed)
Gave avs & calendar for September °

## 2014-07-24 NOTE — Patient Instructions (Addendum)
Flexion (Eccentric) - Active (Cane)   Lift cane with both hands. Avoid hiking shoulders. Lower cane slowly for 3-5 seconds. __10_ reps per set, _2__ sets per day, _7__ days per week.   Copyright  VHI. All rights reserved.  External Rotation   Stand with hands clasped behind head. Pull elbows back as far as possible. Hold __3__ seconds. Repeat _10___ times. Do __2__ sessions per day.  Copyright  VHI. All rights reserved.  Closed Chain: Shoulder Flexion / Extension - on Wall   Hands on wall, step backward. Return. Stepping causes shoulder flexion and extension Do _10__ times, each foot, _2__ times per day.  http://ss.exer.us/265   Copyright  VHI. All rights reserved.  Cane Exercise: Abduction   Hold cane with right hand over end, palm-up, with other hand palm-down. Move arm out from side and up by pushing with other arm. Hold __3__ seconds. Repeat __10__ times. Do __2__ sessions per day.  http://gt2.exer.us/82   Copyright  VHI. All rights reserved.   ABC CLASS After Breast Cancer Class  After Breast Cancer Class is a specially designed exercise class to assist you in a safe recover after having breast cancer surgery.  In this class you will learn how to get back to full function whether your drains were just removed or if you had surgery a month ago.  This one-time class is held the 1st and 3rd Monday of every month from 11:00 a.m. until 12:00 noon at the [facility] located at [place], [place] [address]  This class is FREE and space is limited. For more information or to register for the next available class, call [phone].  Class Goals   Understand specific stretches to improve the flexibility of you chest and shoulder.  Learn ways to safely strengthen your upper body and improve your posture.  Understand the warning signs of infection and why you may be at risk for an arm infection.  Learn about Lymphedema  and prevention.  ** You do not attend this class until after surgery.  Drains must be removed to participate

## 2014-07-29 ENCOUNTER — Encounter: Payer: Self-pay | Admitting: Physical Therapy

## 2014-07-29 ENCOUNTER — Ambulatory Visit: Payer: No Typology Code available for payment source | Admitting: Physical Therapy

## 2014-07-29 DIAGNOSIS — M25611 Stiffness of right shoulder, not elsewhere classified: Secondary | ICD-10-CM | POA: Diagnosis not present

## 2014-07-29 DIAGNOSIS — Z9011 Acquired absence of right breast and nipple: Secondary | ICD-10-CM

## 2014-07-29 NOTE — Patient Instructions (Signed)
CHEST: Doorway, Bilateral - Standing   Standing in doorway, place hands on wall with elbows bent at shoulder height. Lean forward only if you can stand there without pain. Hold _5__ seconds. _3__ reps per set, __2_ sets per day, _7__ days per week  Copyright  VHI. All rights reserved.  Scapular Retraction (Standing)   With arms at sides, pinch shoulder blades together. Hold 3 seconds. Repeat __10__ times per set. Do __1__ sets per session. Do __2__ sessions per day.  http://orth.exer.us/945   Copyright  VHI. All rights reserved.

## 2014-07-29 NOTE — Therapy (Signed)
[place], [place], [address] Phone: [phone]   Fax:  [phone]  Physical Therapy Treatment  Patient Details  Name: Tara Fuller MRN: [id number] Date of Birth: 07/10/1975 Referring Provider:  Rolm Bookbinder, MD  Encounter Date: 07/29/2014      PT End of Session - 07/29/14 1039    Visit Number 2   Number of Visits 8   Date for PT Re-Evaluation 08/21/14   PT Start Time 0928   PT Stop Time 1016   PT Time Calculation (min) 48 min   Activity Tolerance Patient tolerated treatment well   Behavior During Therapy [facility] for tasks assessed/performed      Past Medical History  Diagnosis Date  . 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 Spinal headache 08    c section with spinal -patch  . Heart murmur     ? teen    Past Surgical History  Procedure Laterality Date  . Cesarean section  2008  . Mastectomy w/ sentinel node biopsy  07/09/2014    NIPPLE SPARING  WITH RECONSTRUCTION  . Wisdom tooth extraction    . Nipple sparing mastectomy/sentinal lymph node biopsy/reconstruction/placement of tissue expander Right 07/09/2014    Procedure: RIGHT NIPPLE SPARING MASTECTOMY WITH RIGHT AXILLARY  SENTINAL LYMPH NODE BIOPSY;  Surgeon: Rolm Bookbinder, MD;  Location: [place];  Service: General;  Laterality: Right;  . Breast reconstruction with placement of tissue expander and flex hd (acellular hydrated dermis) Right 07/09/2014    Procedure: RIGHT BREAST RECONSTRUCTION WITH PLACEMENT OF TISSUE EXPANDER AND ALLODERM;  Surgeon: Irene Limbo, MD;  Location: [place];  Service: Plastics;  Laterality: Right;    There were no vitals filed for this visit.  Visit Diagnosis:  H/O right mastectomy  Shoulder stiffness, right      Subjective Assessment - 07/29/14 0930    Subjective I'm draining 38 cc still and it has to be below 30 cc for 24 hours for me to get it out Wednesday. I've gotten so much better  since I've been doing your exercises.  Less soreness and I can lift my arm more!   Patient is accompained by: Family member   Pertinent History Had right mastectomy with sentinel node biopsy (5 nodes) on 07/09/14.  She will start Tamoxifen 08/05/14 but does not need chemo or radiation.  She is having trouble moving her right arm.   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 No/denies                         [facility] Adult PT Treatment/Exercise - 07/29/14 0001    Shoulder Exercises: Supine   Flexion 5 reps;AAROM  with cane and PT support at right upper arm   ABduction AAROM;5 reps  with PT support at right upper arm; some c/o pain in chest   Shoulder Exercises: Standing   ABduction AAROM;10 reps  with cane; PT verbal cues for posture   Shoulder Exercises: Pulleys   Flexion 2 minutes   ABduction 2 minutes   Shoulder Exercises: Therapy Ball   Flexion 10 reps   Manual Therapy   Manual Therapy Passive ROM;Neural Stretch   Manual therapy comments --   Passive ROM PROM right shoulder in supine all planes to pt tolerance   Neural Stretch Right UE in supine to patient tolerance  PT Education - 07/29/14 1014    Education provided Yes   Education Details Scapular retraction and doorway stretch without leaning forward   Person(s) Educated Patient   Methods Explanation;Demonstration;Handout   Comprehension Verbalized understanding;Returned demonstration                [facility] Goals - 07/24/14 1311    CC Long Term Goal  #1   Title Patient will be able to perform home exercise program correctly and independently to increase ROM.   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  She had some pain in her right upper chest with supine cane exercises so those were changed to do in standing instead.  She is pleased with progress.   Pt will benefit from skilled therapeutic intervention in order to improve on the following deficits Decreased range of motion;Decreased knowledge of precautions;Pain;Decreased mobility;Impaired UE functional use   Rehab Potential Excellent   Clinical Impairments Affecting Rehab Potential None   PT Frequency 2x / week   PT Duration 4 weeks   PT Treatment/Interventions Patient/family education;Therapeutic exercise;Manual techniques;Passive range of motion;ADLs/Self Care Home Management   PT Next Visit Plan PROM, pulleys, ball up wall, ROM exericises, review HEP   PT Home Exercise Plan Shoulder ROM HEP   Consulted and Agree with Plan of Care Patient        Problem List Patient Active Problem List   Diagnosis Date Noted  . 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 Genetic testing 07/03/2014  . Family history of breast cancer 07/03/2014  . Family history of cancer 07/03/2014  . Breast cancer, right breast 06/19/2014    Annia Friendly, PT 07/29/2014, 10:44 AM  [place], [place], [address] Phone:  [phone]   Fax:  [phone]

## 2014-07-29 NOTE — Addendum Note (Signed)
Addended by: Arbutus Ped C on: 07/29/2014 04:45 PM   Modules accepted: Orders

## 2014-08-01 ENCOUNTER — Encounter (HOSPITAL_BASED_OUTPATIENT_CLINIC_OR_DEPARTMENT_OTHER): Payer: Self-pay | Admitting: *Deleted

## 2014-08-01 NOTE — H&P (Signed)
  Subjective:    [name] is a 39 y.o. female.  Follow-up  3 weeks post op. Seen yesterday and started on antibiotics for report low grade fever. Called last evening with report temp 100.2. Returns today with concern increased pain, generally not feeling well. No cough. Drain out put unchanged.  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 Started on tamoxifen Patient diagnosed with DCIS Right breast, ER/PR + following screening MMG showing change in calcifications. MRI with no masses or abnormal enhancement bilateral. Calcifications spanned 5 cm. Two aunts with breast ca,genetics negative.   Prior 32/34 A cup. Mastectomy wt 150 g  Review of Systems     Objective:   Physical Exam  Cardiovascular: Regular rhythm and normal heart sounds.  Pulmonary/Chest: Breath sounds normal.  Abdominal: Soft.  Psychiatric:  +anxious and at times tearful   R chest with incision intact, erythema over lower inner and lower outer quadrant, TTP over these areas  Drain serous Scars softer, breast soft, no erythema, no fluid collection     Assessment:     DCIS R breast S/p NSM, TE/ADM reconstruction.    Plan:     Not much change in exam since yesterday but patient overall feels worse and increased pain. Continue antibiotics. If not improved by tomorrow plan OR for washout and removal /replacement of expander.   Mentor Artoura High Profile 225 ml tissue expander,  initial fill volume 135 ml     Irene Limbo, MD [facility] Plastic & Reconstructive Surgery [phone]

## 2014-08-02 ENCOUNTER — Ambulatory Visit (HOSPITAL_BASED_OUTPATIENT_CLINIC_OR_DEPARTMENT_OTHER): Payer: No Typology Code available for payment source | Admitting: Anesthesiology

## 2014-08-02 ENCOUNTER — Ambulatory Visit (HOSPITAL_BASED_OUTPATIENT_CLINIC_OR_DEPARTMENT_OTHER)
Admission: RE | Admit: 2014-08-02 | Discharge: 2014-08-02 | Disposition: A | Discharge status: Home / Self Care | Payer: No Typology Code available for payment source | Source: Ambulatory Visit | Attending: Plastic Surgery | Admitting: Plastic Surgery

## 2014-08-02 ENCOUNTER — Encounter (HOSPITAL_BASED_OUTPATIENT_CLINIC_OR_DEPARTMENT_OTHER)
Admission: RE | Disposition: A | Discharge status: Home / Self Care | Payer: Self-pay | Source: Ambulatory Visit | Attending: Plastic Surgery

## 2014-08-02 ENCOUNTER — Encounter (HOSPITAL_BASED_OUTPATIENT_CLINIC_OR_DEPARTMENT_OTHER): Payer: Self-pay | Admitting: Anesthesiology

## 2014-08-02 DIAGNOSIS — L7622 Postprocedural hemorrhage and hematoma of skin and subcutaneous tissue following other procedure: Secondary | ICD-10-CM | POA: Diagnosis not present

## 2014-08-02 DIAGNOSIS — Z170 Estrogen receptor positive status [ER+]: Secondary | ICD-10-CM | POA: Insufficient documentation

## 2014-08-02 DIAGNOSIS — Z9011 Acquired absence of right breast and nipple: Secondary | ICD-10-CM | POA: Diagnosis not present

## 2014-08-02 DIAGNOSIS — Z853 Personal history of malignant neoplasm of breast: Secondary | ICD-10-CM | POA: Diagnosis not present

## 2014-08-02 DIAGNOSIS — R51 Headache: Secondary | ICD-10-CM | POA: Diagnosis not present

## 2014-08-02 DIAGNOSIS — N61 Inflammatory disorders of breast: Secondary | ICD-10-CM | POA: Diagnosis not present

## 2014-08-02 DIAGNOSIS — L03818 Cellulitis of other sites: Secondary | ICD-10-CM | POA: Diagnosis present

## 2014-08-02 HISTORY — DX: Dental restoration status: Z98.811

## 2014-08-02 HISTORY — PX: TISSUE EXPANDER PLACEMENT: SHX2530

## 2014-08-02 LAB — POCT HEMOGLOBIN-HEMACUE: HEMOGLOBIN: 15.4 g/dL — AB (ref 12.0–15.0)

## 2014-08-02 SURGERY — INSERTION, TISSUE EXPANDER
Anesthesia: General | Site: Breast | Laterality: Right

## 2014-08-02 MED ORDER — VANCOMYCIN HCL IN DEXTROSE 1-5 GM/200ML-% IV SOLN
1000.0000 mg | INTRAVENOUS | Status: AC
  Administered 2014-08-02: 1000 mg via INTRAVENOUS

## 2014-08-02 MED ORDER — FENTANYL CITRATE (PF) 100 MCG/2ML IJ SOLN
50.0000 ug | INTRAMUSCULAR | Status: AC | PRN
Start: 2014-08-02 — End: 2014-08-02
  Administered 2014-08-02: 25 ug via INTRAVENOUS
  Administered 2014-08-02: 100 ug via INTRAVENOUS
  Administered 2014-08-02: 25 ug via INTRAVENOUS
  Administered 2014-08-02: 50 ug via INTRAVENOUS

## 2014-08-02 MED ORDER — SODIUM CHLORIDE 0.9 % IV SOLN
INTRAVENOUS | Status: DC | PRN
  Administered 2014-08-02: 1000 mL

## 2014-08-02 MED ORDER — SCOPOLAMINE 1 MG/3DAYS TD PT72: 1.0000 | MEDICATED_PATCH | Freq: Once | TRANSDERMAL | Status: DC | PRN

## 2014-08-02 MED ORDER — GLYCOPYRROLATE 0.2 MG/ML IJ SOLN: 0.2000 mg | Freq: Once | INTRAMUSCULAR | Status: DC | PRN

## 2014-08-02 MED ORDER — ONDANSETRON HCL 4 MG/2ML IJ SOLN
INTRAMUSCULAR | Status: DC | PRN
  Administered 2014-08-02: 4 mg via INTRAVENOUS

## 2014-08-02 MED ORDER — MIDAZOLAM HCL 2 MG/2ML IJ SOLN
1.0000 mg | INTRAMUSCULAR | Status: DC | PRN
  Administered 2014-08-02: 2 mg via INTRAVENOUS

## 2014-08-02 MED ORDER — AMOXICILLIN-POT CLAVULANATE 500-125 MG PO TABS: 1.0000 | ORAL_TABLET | Freq: Three times a day (TID) | ORAL | Status: DC

## 2014-08-02 MED ORDER — DEXAMETHASONE SODIUM PHOSPHATE 4 MG/ML IJ SOLN
INTRAMUSCULAR | Status: DC | PRN
  Administered 2014-08-02: 10 mg via INTRAVENOUS

## 2014-08-02 MED ORDER — LACTATED RINGERS IV SOLN
INTRAVENOUS | Status: DC
  Administered 2014-08-02 (×3): via INTRAVENOUS

## 2014-08-02 MED ORDER — FENTANYL CITRATE (PF) 100 MCG/2ML IJ SOLN
INTRAMUSCULAR | Status: AC
  Filled 2014-08-02: qty 4

## 2014-08-02 MED ORDER — TRAMADOL HCL 50 MG PO TABS
ORAL_TABLET | ORAL | Status: AC
  Filled 2014-08-02: qty 1

## 2014-08-02 MED ORDER — MIDAZOLAM HCL 2 MG/2ML IJ SOLN
INTRAMUSCULAR | Status: AC
  Filled 2014-08-02: qty 2

## 2014-08-02 MED ORDER — LIDOCAINE HCL (CARDIAC) 20 MG/ML IV SOLN
INTRAVENOUS | Status: DC | PRN
  Administered 2014-08-02: 60 mg via INTRAVENOUS

## 2014-08-02 MED ORDER — TRAMADOL HCL 50 MG PO TABS
50.0000 mg | ORAL_TABLET | Freq: Once | ORAL | Status: AC | PRN
  Administered 2014-08-02: 50 mg via ORAL

## 2014-08-02 MED ORDER — HYDROMORPHONE HCL 1 MG/ML IJ SOLN
INTRAMUSCULAR | Status: AC
  Filled 2014-08-02: qty 1

## 2014-08-02 MED ORDER — VANCOMYCIN HCL IN DEXTROSE 1-5 GM/200ML-% IV SOLN
INTRAVENOUS | Status: AC
  Filled 2014-08-02: qty 200

## 2014-08-02 MED ORDER — PROPOFOL 10 MG/ML IV BOLUS
INTRAVENOUS | Status: DC | PRN
  Administered 2014-08-02: 150 mg via INTRAVENOUS

## 2014-08-02 MED ORDER — HYDROMORPHONE HCL 1 MG/ML IJ SOLN
0.2500 mg | INTRAMUSCULAR | Status: DC | PRN
  Administered 2014-08-02 (×2): 0.5 mg via INTRAVENOUS

## 2014-08-02 SURGICAL SUPPLY — 61 items
BAG DECANTER FOR FLEXI CONT (MISCELLANEOUS) ×3 IMPLANT
BINDER BREAST LRG (GAUZE/BANDAGES/DRESSINGS) IMPLANT
BINDER BREAST MEDIUM (GAUZE/BANDAGES/DRESSINGS) ×3 IMPLANT
BINDER BREAST XLRG (GAUZE/BANDAGES/DRESSINGS) IMPLANT
BINDER BREAST XXLRG (GAUZE/BANDAGES/DRESSINGS) IMPLANT
BLADE SURG 15 STRL LF DISP TIS (BLADE) ×1 IMPLANT
BLADE SURG 15 STRL SS (BLADE) ×2
BNDG GAUZE ELAST 4 BULKY (GAUZE/BANDAGES/DRESSINGS) IMPLANT
CANISTER SUCT 1200ML W/VALVE (MISCELLANEOUS) ×6 IMPLANT
CHLORAPREP W/TINT 26ML (MISCELLANEOUS) ×3 IMPLANT
CLOSURE WOUND 1/2 X4 (GAUZE/BANDAGES/DRESSINGS) ×1
COVER BACK TABLE 60X90IN (DRAPES) ×3 IMPLANT
COVER MAYO STAND STRL (DRAPES) ×3 IMPLANT
DECANTER SPIKE VIAL GLASS SM (MISCELLANEOUS) IMPLANT
DRAIN CHANNEL 15F RND FF W/TCR (WOUND CARE) ×3 IMPLANT
DRAPE LAPAROSCOPIC ABDOMINAL (DRAPES) ×3 IMPLANT
DRAPE U-SHAPE 76X120 STRL (DRAPES) IMPLANT
DRSG PAD ABDOMINAL 8X10 ST (GAUZE/BANDAGES/DRESSINGS) ×6 IMPLANT
ELECT BLADE 4.0 EZ CLEAN MEGAD (MISCELLANEOUS) ×3
ELECT COATED BLADE 2.86 ST (ELECTRODE) ×3 IMPLANT
ELECT REM PT RETURN 9FT ADLT (ELECTROSURGICAL) ×3
ELECTRODE BLDE 4.0 EZ CLN MEGD (MISCELLANEOUS) ×1 IMPLANT
ELECTRODE REM PT RTRN 9FT ADLT (ELECTROSURGICAL) ×1 IMPLANT
EVACUATOR SILICONE 100CC (DRAIN) ×3 IMPLANT
GLOVE BIO SURGEON STRL SZ 6 (GLOVE) ×6 IMPLANT
GLOVE BIO SURGEON STRL SZ 6.5 (GLOVE) IMPLANT
GLOVE BIO SURGEONS STRL SZ 6.5 (GLOVE)
GLOVE BIOGEL PI IND STRL 7.0 (GLOVE) ×2 IMPLANT
GLOVE BIOGEL PI INDICATOR 7.0 (GLOVE) ×4
GLOVE ECLIPSE 6.5 STRL STRAW (GLOVE) ×6 IMPLANT
GOWN STRL REUS W/ TWL LRG LVL3 (GOWN DISPOSABLE) ×3 IMPLANT
GOWN STRL REUS W/TWL LRG LVL3 (GOWN DISPOSABLE) ×6
IV NS 500ML (IV SOLUTION) ×2
IV NS 500ML BAXH (IV SOLUTION) ×1 IMPLANT
KIT FILL SYSTEM UNIVERSAL (SET/KITS/TRAYS/PACK) ×3 IMPLANT
LIQUID BAND (GAUZE/BANDAGES/DRESSINGS) ×3 IMPLANT
NEEDLE PRECISIONGLIDE 27X1.5 (NEEDLE) ×3 IMPLANT
PACK BASIN DAY SURGERY FS (CUSTOM PROCEDURE TRAY) ×3 IMPLANT
PENCIL BUTTON HOLSTER BLD 10FT (ELECTRODE) ×3 IMPLANT
PIN SAFETY STERILE (MISCELLANEOUS) IMPLANT
SLEEVE SCD COMPRESS KNEE MED (MISCELLANEOUS) ×3 IMPLANT
SPONGE LAP 18X18 X RAY DECT (DISPOSABLE) ×6 IMPLANT
STAPLER VISISTAT 35W (STAPLE) ×3 IMPLANT
STRIP CLOSURE SKIN 1/2X4 (GAUZE/BANDAGES/DRESSINGS) ×2 IMPLANT
SUCTION FRAZIER TIP 10 FR DISP (SUCTIONS) IMPLANT
SUT ETHILON 2 0 FS 18 (SUTURE) ×3 IMPLANT
SUT ETHILON 4 0 PS 2 18 (SUTURE) ×3 IMPLANT
SUT MNCRL AB 4-0 PS2 18 (SUTURE) ×3 IMPLANT
SUT VIC AB 3-0 PS1 18 (SUTURE)
SUT VIC AB 3-0 PS1 18XBRD (SUTURE) IMPLANT
SUT VIC AB 3-0 SH 27 (SUTURE) ×4
SUT VIC AB 3-0 SH 27X BRD (SUTURE) ×2 IMPLANT
SUT VICRYL 4-0 PS2 18IN ABS (SUTURE) ×3 IMPLANT
SYR BULB IRRIGATION 50ML (SYRINGE) ×3 IMPLANT
SYR CONTROL 10ML LL (SYRINGE) ×3 IMPLANT
TOWEL OR 17X24 6PK STRL BLUE (TOWEL DISPOSABLE) ×6 IMPLANT
TUBE CONNECTING 20'X1/4 (TUBING) ×2
TUBE CONNECTING 20X1/4 (TUBING) ×4 IMPLANT
UNDERPAD 30X30 (UNDERPADS AND DIAPERS) ×6 IMPLANT
YANKAUER SUCT BULB TIP NO VENT (SUCTIONS) ×3 IMPLANT
expander ×3 IMPLANT

## 2014-08-02 NOTE — Anesthesia Postprocedure Evaluation (Signed)
  Anesthesia Post-op Note  Patient: Tara Fuller  Procedure(s) Performed: Procedure(s): REMOVAL AND PLACEMENT OF RIGHT TISSUE EXPANDER  (Right)  Patient Location: PACU  Anesthesia Type:General  Level of Consciousness: awake and alert   Airway and Oxygen Therapy: Patient Spontanous Breathing  Post-op Pain: Controlled  Post-op Assessment: Post-op Vital signs reviewed, Patient's Cardiovascular Status Stable and Respiratory Function Stable  Post-op Vital Signs: Reviewed  Filed Vitals:   08/02/14 1202  BP:   Pulse: 98  Temp:   Resp: 21    Complications: No apparent anesthesia complications

## 2014-08-02 NOTE — Interval H&P Note (Signed)
History and Physical Interval Note:  08/02/2014 9:46 AM  Tara Fuller  has presented today for surgery, with the diagnosis of ACQUIRED ABSENCE OF RIGHT BREAST, DCIS RIGHT BREAST  The various methods of treatment have been discussed with the patient and family. After consideration of risks, benefits and other options for treatment, the patient has consented to  Procedure(s): REMOVAL AND PLACEMENT OF RIGHT TISSUE EXPANDER  (Right) as a surgical intervention .  The patient's history has been reviewed, patient examined, no change in status, stable for surgery.  I have reviewed the patient's chart and labs.  Questions were answered to the patient's satisfaction.     Orlandus Borowski

## 2014-08-02 NOTE — Anesthesia Preprocedure Evaluation (Addendum)
Anesthesia Evaluation  Patient identified by MRN, date of birth, ID band Patient awake    Reviewed: Allergy & Precautions, H&P , NPO status , Patient's Chart, lab work & pertinent test results  Airway Mallampati: I  TM Distance: >3 FB Neck ROM: Full    Dental no notable dental hx. (+) Teeth Intact, Dental Advisory Given   Pulmonary neg pulmonary ROS,  breath sounds clear to auscultation  Pulmonary exam normal       Cardiovascular negative cardio ROS  Rhythm:Regular Rate:Normal     Neuro/Psych  Headaches, negative neurological ROS  negative psych ROS   GI/Hepatic negative GI ROS, Neg liver ROS,   Endo/Other  negative endocrine ROS  Renal/GU negative Renal ROS  negative genitourinary   Musculoskeletal   Abdominal   Peds  Hematology negative hematology ROS (+)   Anesthesia Other Findings   Reproductive/Obstetrics negative OB ROS                            Anesthesia Physical Anesthesia Plan  ASA: II  Anesthesia Plan: General   Post-op Pain Management:    Induction: Intravenous  Airway Management Planned: LMA and Oral ETT  Additional Equipment:   Intra-op Plan:   Post-operative Plan: Extubation in OR  Informed Consent: I have reviewed the patients History and Physical, chart, labs and discussed the procedure including the risks, benefits and alternatives for the proposed anesthesia with the patient or authorized representative who has indicated his/her understanding and acceptance.   Dental advisory given  Plan Discussed with: CRNA  Anesthesia Plan Comments:        Anesthesia Quick Evaluation

## 2014-08-02 NOTE — Transfer of Care (Signed)
Immediate Anesthesia Transfer of Care Note  Patient: Tara Fuller  Procedure(s) Performed: Procedure(s): REMOVAL AND PLACEMENT OF RIGHT TISSUE EXPANDER  (Right)  Patient Location: PACU  Anesthesia Type:General  Level of Consciousness: sedated  Airway & Oxygen Therapy: Patient Spontanous Breathing and Patient connected to face mask oxygen  Post-op Assessment: Report given to RN and Post -op Vital signs reviewed and stable  Post vital signs: Reviewed and stable  Last Vitals:  Filed Vitals:   08/02/14 0907  BP: 120/73  Pulse: 128  Temp: 36.7 C  Resp: 22    Complications: No apparent anesthesia complications

## 2014-08-02 NOTE — Anesthesia Procedure Notes (Signed)
Procedure Name: LMA Insertion Date/Time: 08/02/2014 10:00 AM Performed by: Maryella Shivers Pre-anesthesia Checklist: Patient identified, Emergency Drugs available, Suction available and Patient being monitored Patient Re-evaluated:Patient Re-evaluated prior to inductionOxygen Delivery Method: Circle System Utilized Preoxygenation: Pre-oxygenation with 100% oxygen Intubation Type: IV induction Ventilation: Mask ventilation without difficulty LMA: LMA inserted LMA Size: 4.0 Number of attempts: 1 Airway Equipment and Method: Bite block Placement Confirmation: positive ETCO2 Tube secured with: Tape Dental Injury: Teeth and Oropharynx as per pre-operative assessment

## 2014-08-02 NOTE — Discharge Instructions (Signed)
Post Anesthesia Home Care Instructions  Activity: Get plenty of rest for the remainder of the day. A responsible adult should stay with you for 24 hours following the procedure.  For the next 24 hours, DO NOT: -Drive a car -Paediatric nurse -Drink alcoholic beverages -Take any medication unless instructed by your physician -Make any legal decisions or sign important papers.  Meals: Start with liquid foods such as gelatin or soup. Progress to regular foods as tolerated. Avoid greasy, spicy, heavy foods. If nausea and/or vomiting occur, drink only clear liquids until the nausea and/or vomiting subsides. Call your physician if vomiting continues.  Special Instructions/Symptoms: Your throat may feel dry or sore from the anesthesia or the breathing tube placed in your throat during surgery. If this causes discomfort, gargle with warm salt water. The discomfort should disappear within 24 hours.  If you had a scopolamine patch placed behind your ear for the management of post- operative nausea and/or vomiting:  1. The medication in the patch is effective for 72 hours, after which it should be removed.  Wrap patch in a tissue and discard in the trash. Wash hands thoroughly with soap and water. 2. You may remove the patch earlier than 72 hours if you experience unpleasant side effects which may include dry mouth, dizziness or visual disturbances. 3. Avoid touching the patch. Wash your hands with soap and water after contact with the patch.   About my Jackson-Pratt Bulb Drain  What is a Jackson-Pratt bulb? A Jackson-Pratt is a soft, round device used to collect drainage. It is connected to a long, thin drainage catheter, which is held in place by one or two small stiches near your surgical incision site. When the bulb is squeezed, it forms a vacuum, forcing the drainage to empty into the bulb.  Emptying the Jackson-Pratt bulb- To empty the bulb: 1. Release the plug on the top of the bulb. 2.  Pour the bulb's contents into a measuring container which your nurse will provide. 3. Record the time emptied and amount of drainage. Empty the drain(s) as often as your     doctor or nurse recommends.  Date                  Time                    Amount (Drain 1)                 Amount (Drain 2)  _____________________________________________________________________  _____________________________________________________________________  _____________________________________________________________________  _____________________________________________________________________  _____________________________________________________________________  _____________________________________________________________________  _____________________________________________________________________  _____________________________________________________________________  Squeezing the Jackson-Pratt Bulb- To squeeze the bulb: 1. Make sure the plug at the top of the bulb is open. 2. Squeeze the bulb tightly in your fist. You will hear air squeezing from the bulb. 3. Replace the plug while the bulb is squeezed. 4. Use a safety pin to attach the bulb to your clothing. This will keep the catheter from     pulling at the bulb insertion site.  When to call your doctor- Call your doctor if:  Drain site becomes red, swollen or hot.  You have a fever greater than 101 degrees F.  There is oozing at the drain site.  Drain falls out (apply a guaze bandage over the drain hole and secure it with tape).  Drainage increases daily not related to activity patterns. (You will usually have more drainage when you are active than when you are resting.)  Drainage has a bad  odor. ° ° °

## 2014-08-02 NOTE — Op Note (Signed)
Operative Note   DATE OF OPERATION: 7.29.2016  LOCATION: [facility] - outpatient  SURGICAL DIVISION: Plastic Surgery  PREOPERATIVE DIAGNOSES:  1. Acquired absence breast 2. History breast cancer 3. Cellulitis reconstructed breast  POSTOPERATIVE DIAGNOSES:  1. Acquired absence breast 2. History breast cancer 3. Cellulitis reconstructed breast 4. Seroma right chest  PROCEDURE:  1. Removal and replacement tissue expander right chest  SURGEON: Irene Limbo MD MBA  ASSISTANT: none  ANESTHESIA:  General.   EBL: 50 ml  COMPLICATIONS: None.   INDICATIONS FOR PROCEDURE:  The patient, Tara Fuller, is a 40 y.o. female born on August 07, 1975, is here for exchange of tissue expander and wash out breast cavity for cellulitis that has not resolved with oral antibiotics   FINDINGS: Seroma fluid within breast cavity despite presence of drain.[place] High Profile 225 ml tissue expander placed, fill volume 135 ml. Ref [id number] SN [id number]  DESCRIPTION OF PROCEDURE:  The patient's operative site was marked with the patient in the preoperative area. The patient was taken to the operating room. SCDs were placed and IV antibiotics were given. The patient's preexisting drain was removed. The patient's operative site was prepped and draped in a sterile fashion. A time out was performed and all information was confirmed to be correct. Incision made through prior inframammary incision. Skin flaps elevated off underlying muscle and fascial layer. Junction between pectoralis and serratus muscle closure opened with cautery. On entering cavity large amount straw colored fluid drained consistent with seroma. This was sent for culture. Expander removed. Examination of cavity revealed no other concerns, the area of Alloderm placement was adherent to mastectomy flaps and appeared to be incorporating. Given the cellulitis and concern for contamination, I elected to remove the acellular dermis. Cavity  treated by curettage and irrigated with solution containing Ancef, gentamicin, and bacitracin. Inspected for hemostasis and new 15 Fr Jp drain placed and secured with 2-0 nylon. Tissue expander prepared and placed in cavity. Secured to chest wall with 3-0 vicryl Drain placed beneath mastectomy flap and above expander. Closure completed with 3-0 vicryl interrupted to re approximate muscle closure. 3-0 vicrl then used in superficial fascial layer, 409 vicryl in dermis and 4-0 nylon for skin closure. Tissue adhesive appleid followed by steri strips, dry dressing and binder.  The patient was allowed to wake from anesthesia, extubated and taken to the recovery room in satisfactory condition.   SPECIMENS: seroma fluid for culture  DRAINS: Tara Fuller  Irene Limbo, MD [facility] Plastic & Reconstructive Surgery [phone]

## 2014-08-05 ENCOUNTER — Ambulatory Visit: Payer: No Typology Code available for payment source | Admitting: Physical Therapy

## 2014-08-05 ENCOUNTER — Encounter (HOSPITAL_BASED_OUTPATIENT_CLINIC_OR_DEPARTMENT_OTHER): Payer: Self-pay | Admitting: Plastic Surgery

## 2014-08-05 LAB — WOUND CULTURE

## 2014-08-06 ENCOUNTER — Encounter (HOSPITAL_BASED_OUTPATIENT_CLINIC_OR_DEPARTMENT_OTHER): Payer: Self-pay | Admitting: Plastic Surgery

## 2014-08-07 ENCOUNTER — Ambulatory Visit: Payer: No Typology Code available for payment source

## 2014-08-07 LAB — ANAEROBIC CULTURE

## 2014-08-14 ENCOUNTER — Ambulatory Visit: Payer: No Typology Code available for payment source | Attending: General Surgery

## 2014-08-14 DIAGNOSIS — Z9011 Acquired absence of right breast and nipple: Secondary | ICD-10-CM | POA: Diagnosis present

## 2014-08-14 DIAGNOSIS — M25611 Stiffness of right shoulder, not elsewhere classified: Secondary | ICD-10-CM | POA: Diagnosis present

## 2014-08-14 NOTE — Therapy (Signed)
[place], [place], [address] Phone: [phone]   Fax:  [phone]  Physical Therapy Treatment  Patient Details  Name: Tara Fuller MRN: [id number] Date of Birth: August 25, 1975 Referring Provider:  Rolm Bookbinder, MD  Encounter Date: 08/14/2014      PT End of Session - 08/14/14 1159    Visit Number 3   Number of Visits 8   Date for PT Re-Evaluation 08/21/14   PT Start Time 1106   PT Stop Time 1151   PT Time Calculation (min) 45 min   Activity Tolerance Patient tolerated treatment well   Behavior During Therapy [facility] for tasks assessed/performed      Past Medical History  Diagnosis Date  . History of cardiac murmur     states had echo many years ago; no know problems  . 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 Low grade fever 08/01/2014    100.2 has been the highest, per pt.  Marland Kitchen Spinal headache     after C-section  . Dental crowns present     x 2 - left upper back teeth    Past Surgical History  Procedure Laterality Date  . Cesarean section  2008  . Mastectomy w/ sentinel node biopsy  07/09/2014    NIPPLE SPARING  WITH RECONSTRUCTION  . Wisdom tooth extraction    . Nipple sparing mastectomy/sentinal lymph node biopsy/reconstruction/placement of tissue expander Right 07/09/2014    Procedure: RIGHT NIPPLE SPARING MASTECTOMY WITH RIGHT AXILLARY  SENTINAL LYMPH NODE BIOPSY;  Surgeon: Rolm Bookbinder, MD;  Location: [facility];  Service: General;  Laterality: Right;  . Breast reconstruction with placement of tissue expander and flex hd (acellular hydrated dermis) Right 07/09/2014    Procedure: RIGHT BREAST RECONSTRUCTION WITH PLACEMENT OF TISSUE EXPANDER AND ALLODERM;  Surgeon: Irene Limbo, MD;  Location: [place];  Service: Plastics;  Laterality: Right;  . Tissue expander placement Right 08/02/2014    Procedure: REMOVAL AND PLACEMENT OF RIGHT TISSUE EXPANDER ;  Surgeon: Irene Limbo, MD;  Location: [facility];  Service: Plastics;  Laterality: Right;    There were no vitals filed for this visit.  Visit Diagnosis:  H/O right mastectomy  Shoulder stiffness, right      Subjective Assessment - 08/14/14 1111    Subjective Had to have another surgery the same week after my last visit here. Ended up with a small infection that the culture showed, doctor could not see it when she replaced my expander. So I still have my drains in due to second surgery. Been having some soreness in my Rt elbow intermittently.    Currently in Pain? No/denies            [facility] PT Assessment - 08/14/14 0001    AROM   Right Shoulder Flexion 125 Degrees   Right Shoulder ABduction 136 Degrees                     [facility] Adult PT Treatment/Exercise - 08/14/14 0001    Shoulder Exercises: Standing   Other Standing Exercises Neural tension stretch on wall 3 reps, 5 second holds   Shoulder Exercises: Pulleys   Flexion 2 minutes   ABduction 2 minutes   Shoulder Exercises: Therapy Ball   Flexion 10 reps   Manual Therapy   Soft tissue mobilization Very gentle skin stretches done over Rt chest wall where pt c/o soreness after stretching and instructed pt  in this to continue at home and light scar tissue massage over axilla incision as this felt tight and pts c/o tightness seem to come to a point here. Pt returned correct demo.    Passive ROM PROM right shoulder in supine all planes to pt tolerance   Neural Stretch Right UE in supine to patient tolerance                PT Education - 08/14/14 1158    Education provided Yes   Education Details Neural tension stretch on wall; light scar tissue massage over near axilla incision and skin stretch to Rt chest wall with horizontal hand placement   Person(s) Educated Patient   Methods Explanation;Demonstration   Comprehension Verbalized understanding;Returned demonstration                [facility] Goals - 08/14/14 1203    CC Long Term Goal  #1   Title Patient will be able to perform home exercise program correctly and independently to increase ROM.   Status On-going   CC Long Term Goal  #2   Title Patient will be able to increase right shoulder active flexion to >/= 130 degrees for increased ease reaching overhead  125 degrees attained today 08/14/14   Status On-going   CC Long Term Goal  #3   Title Patient will be able to increase right shoulder active abduction to >/= 130 degrees for increased ease reaching overhead  136 degrees attained today 08/14/14   Status Achieved   CC Long Term Goal  #4   Title Patient will report she is able to dry her hair with >/= 50% less difficulty  Reports minimal improvement with this thus far due to having second surgery.   Status On-going   CC Long Term Goal  #5   Title Patient will be able to return to her gym doing gentle exercise and will verbalize understanding of how to progress to prior level of exercise   Status On-going            Plan - 08/14/14 1200    Clinical Impression Statement Pt hasn't been seen at our clinic for past 2 weeks due to having to have a second unplanned surgery to replace her expander due to having a small infection. She said surgeon reported couldn't see infection during surgery but culture came back positive so she has been on another antibiotic since then. She reports has been continuing to do her HEP as tolerated since then and feels like she has continued toimprve and her AROM measurements supports this. She will be at the beach next [place] so will be back week of 8/22 for next appt and is to cont HEP stretching until then. Pt had some trouble completely relaxing during PROM so also instructed pt in trying to avoid protective posture/UE guarding as much as possible throughout day and she agreed.   Pt will benefit from skilled therapeutic intervention in order to improve on the following deficits Decreased  range of motion;Decreased knowledge of precautions;Pain;Decreased mobility;Impaired UE functional use   Rehab Potential Excellent   Clinical Impairments Affecting Rehab Potential None   PT Frequency 2x / week   PT Duration 4 weeks   PT Treatment/Interventions Patient/family education;Therapeutic exercise;Manual techniques;Passive range of motion;ADLs/Self Care Home Management   PT Next Visit Plan Pt will require either discharge or renewal next visit in 2 weeks; assess for that then; cont PROM, pulleys, ball up wall, ROM exericises, review HEP  PT Home Exercise Plan Cont Shoulder ROM HEP and add neural tension stretch   Consulted and Agree with Plan of Care Patient        Problem List Patient Active Problem List   Diagnosis Date Noted  . 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 Genetic testing 07/03/2014  . Family history of breast cancer 07/03/2014  . Family history of cancer 07/03/2014  . Breast cancer, right breast 06/19/2014    Otelia Limes, PTA 08/14/2014, 12:08 PM  [place], [place], [address] Phone: [phone]   Fax:  [phone]

## 2014-08-27 ENCOUNTER — Telehealth: Payer: Self-pay | Admitting: Adult Health

## 2014-08-27 NOTE — Telephone Encounter (Signed)
I left a voicemail for Tara Fuller and asked that she return my call to discuss her eligibility to come see [place] in the survivorship clinic now that she has completed treatment for breast cancer.  I left my direct office number in the voicemail.  We look forward to participating in her care.   Mike Craze, NP [place] [phone]

## 2014-08-27 NOTE — Telephone Encounter (Signed)
Ms. Brazzle returned my call regarding her eligibility to see [place] in survivorship.  At this time, she would prefer to have her survivorship resources mailed to her, as she is recovering well from surgery and only recently began taking the tamoxifen (on 08/26/14) and therefore does not have any side effects yet.   She did, however, have many questions regarding the tamoxifen.  She was concerned about potential side effects of tamoxifen, when I again reviewed the most common effects with her (hot flashes, vaginal wetness, joint or muscle aches/pains, etc).  She expressed concern regarding her menstrual cycle and how the Tamoxifen would affect them.  I let her know that her periods will likely become irregular, but to let us know if she was bleeding more than her previous normal menstrual cycle.  We talked about the very rare potential side effect of endometrial/uterine cancer as a result of tamoxifen and encouraged her to report any abnormal vaginal bleeding.   She wanted to know about the safety of taking different medications while taking tamoxifen (Tylenol, Diazepam prn, or a multivitamin).  I offered her reassurance that all were safe to take with tamoxifen.  She asked if it was safe to drink a glass of wine occasionally, and I told her that as long as she is not heavily drinking that it would be safe to have an occasional glass of wine.  I reinforced the importance of transitioning to a life after cancer that allows her to celebrate her survivorship.  She expressed gratitude for answering her questions and I provided reassurance that she could call anytime with additional questions or concerns.   Based on her wishes, we will plan to mail a copy of her survivorship care plan to her home for her review since she declined an in-person meeting at this time.  We would be happy to see her in clinic at any point in the future, if needed. We appreciate the opportunity to participate in her care.   Mike Craze,  NP [place] [phone]

## 2014-08-28 ENCOUNTER — Ambulatory Visit: Payer: No Typology Code available for payment source

## 2014-08-28 DIAGNOSIS — Z9011 Acquired absence of right breast and nipple: Secondary | ICD-10-CM | POA: Diagnosis not present

## 2014-08-28 DIAGNOSIS — M25611 Stiffness of right shoulder, not elsewhere classified: Secondary | ICD-10-CM

## 2014-08-28 NOTE — Patient Instructions (Signed)
Standing 3 way raises (used 2 lbs today)      Cancer Rehab [phone] 1. Arms straight out in front 2. Arms a little wider in a "V" 3. Then arms out to sides in a "T"  All 10 reps, 1-2 sets, 1 time/day.   Leaning against wall with theraband in both hands, "pull bow back" with one arm squeezing shoulder blade to spine, then switch. 10 reps, 1-2 sets.  Over Head Pull: Narrow Grip        On back, knees bent, feet flat, band across thighs, elbows straight but relaxed. Pull hands apart (start). Keeping elbows straight, bring arms up and over head, hands toward floor. Keep pull steady on band. Hold momentarily. Return slowly, keeping pull steady, back to start. Repeat 10-15___ times, 1-2 sets. Band color _green_____   Side Pull: Double Arm   On back, knees bent, feet flat. Arms perpendicular to body, shoulder level, elbows straight but relaxed. Pull arms out to sides, elbows straight. Resistance band comes across collarbones, hands toward floor. Hold momentarily. Slowly return to starting position. Repeat _10-15__ times, 1-2 sets. Band color _green____   Sash   On back, knees bent, feet flat, left hand on left hip, right hand above left. Pull right arm DIAGONALLY (hip to shoulder) across chest. Bring right arm along head toward floor. Hold momentarily. Slowly return to starting position. Repeat _10-15__ times, 1-2 sets. Do with left arm. Band color _green_____   Shoulder Rotation: Double Arm   On back, knees bent, feet flat, elbows tucked at sides, bent 90, hands palms up. Pull hands apart and down toward floor, keeping elbows near sides. Hold momentarily. Slowly return to starting position. Repeat _10-15__ times, 1-2 sets. Band color _green_____

## 2014-08-28 NOTE — Therapy (Signed)
[place], [place], [address] Phone: [phone]   Fax:  [phone]  Physical Therapy Treatment  Patient Details  Name: Tara Fuller MRN: [id number] Date of Birth: 1975/07/28 Referring Provider:  Irene Limbo, MD  Encounter Date: 08/28/2014      PT End of Session - 08/28/14 1432    Visit Number 4   Number of Visits 8   Date for PT Re-Evaluation 09/25/14   PT Start Time 1350   PT Stop Time 1432   PT Time Calculation (min) 42 min   Activity Tolerance Patient tolerated treatment well   Behavior During Therapy [facility] for tasks assessed/performed      Past Medical History  Diagnosis Date  . History of cardiac murmur     states had echo many years ago; no know problems  . 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 Low grade fever 08/01/2014    100.2 has been the highest, per pt.  Marland Kitchen Spinal headache     after C-section  . Dental crowns present     x 2 - left upper back teeth    Past Surgical History  Procedure Laterality Date  . Cesarean section  2008  . Mastectomy w/ sentinel node biopsy  07/09/2014    NIPPLE SPARING  WITH RECONSTRUCTION  . Wisdom tooth extraction    . Nipple sparing mastectomy/sentinal lymph node biopsy/reconstruction/placement of tissue expander Right 07/09/2014    Procedure: RIGHT NIPPLE SPARING MASTECTOMY WITH RIGHT AXILLARY  SENTINAL LYMPH NODE BIOPSY;  Surgeon: Rolm Bookbinder, MD;  Location: [place];  Service: General;  Laterality: Right;  . Breast reconstruction with placement of tissue expander and flex hd (acellular hydrated dermis) Right 07/09/2014    Procedure: RIGHT BREAST RECONSTRUCTION WITH PLACEMENT OF TISSUE EXPANDER AND ALLODERM;  Surgeon: Irene Limbo, MD;  Location: [place];  Service: Plastics;  Laterality: Right;  . Tissue expander placement Right 08/02/2014    Procedure: REMOVAL AND PLACEMENT OF RIGHT TISSUE EXPANDER ;  Surgeon: Irene Limbo, MD;  Location: [place];  Service: Plastics;  Laterality: Right;    There were no vitals filed for this visit.  Visit Diagnosis:  H/O right mastectomy  Shoulder stiffness, right      Subjective Assessment - 08/28/14 1353    Subjective Doing well since coming here last, just saw Dr. Felicita Gage and she said no restrictions. So I haven't been able to do my exercising as much as I'd like.    Currently in Pain? No/denies            [facility] PT Assessment - 08/28/14 0001    AROM   Right Shoulder Flexion 143 Degrees   Right Shoulder ABduction 153 Degrees                     [facility] Adult PT Treatment/Exercise - 08/28/14 0001    Shoulder Exercises: Supine   Horizontal ABduction Strengthening;Both;10 reps;Theraband   Theraband Level (Shoulder Horizontal ABduction) Level 3 (Green)   External Rotation Strengthening;Both;10 reps;Theraband  Required tactile cuing for proper technique after PTA demo   Theraband Level (Shoulder External Rotation) Level 3 (Green)   Flexion Strengthening;Both;10 reps;Theraband  With narrow grip   Theraband Level (Shoulder Flexion) Level 3 (Green)   Shoulder Exercises: Standing   Flexion Strengthening;Both;10 reps;Weights  To 90 degrees   Shoulder Flexion Weight (lbs) 2   ABduction Strengthening;Both;10 reps;Weights  To 90 degrees  Shoulder ABduction Weight (lbs) 2   ABduction Limitations C/o Rt elbow nerve pain after this but it decreased shortly after completing exercise   Other Standing Exercises Neural tension stretch on wall 3 reps, 5 second holds   Other Standing Exercises Scaption; strengthening; 10 reps; 2 lbs to 90 degrees   Shoulder Exercises: Pulleys   Flexion 2 minutes   ABduction 2 minutes   Shoulder Exercises: Therapy Ball   Flexion 10 reps  Roll yellow ball up wall   Shoulder Exercises: ROM/Strengthening   Other ROM/Strengthening Exercises Finger Ladder for Rt UE abduction up to 23 for 10 reps     Manual Therapy   Soft tissue mobilization At Rt pectoralis at area of tightness and Rt lateral scapula/latissimus area at other area of tightness with PROM for gentle trigger point release   Passive ROM To Rt shoulder after exercises into flexion and abduction to pts tolerance.                 PT Education - 08/28/14 1419    Education provided Yes   Education Details Shoulder strengthening   Person(s) Educated Patient   Methods Explanation;Demonstration;Handout   Comprehension Verbalized understanding;Returned demonstration                [facility] Goals - 08/28/14 1358    CC Long Term Goal  #1   Title Patient will be able to perform home exercise program correctly and independently to increase ROM.  Progressed HEP today   Status On-going   CC Long Term Goal  #2   Title Patient will be able to increase right shoulder active flexion to >/= 130 degrees for increased ease reaching overhead  143 degrees attained today 08/28/14   Status Achieved   CC Long Term Goal  #3   Title Patient will be able to increase right shoulder active abduction to >/= 130 degrees for increased ease reaching overhead  153 degrees attained today 08/28/14   Status Achieved   CC Long Term Goal  #4   Title Patient will report she is able to dry her hair with >/= 50% less difficulty  Hasn't used her hair dryer though can use flat iron with no difficulty at the back of her head.    Status Achieved   CC Long Term Goal  #5   Title Patient will be able to return to her gym doing gentle exercise and will verbalize understanding of how to progress to prior level of exercise  Patient was just cleared today of restrictions from doctor so has yet to return to gym   Status On-going            Plan - 08/28/14 1655    Clinical Impression Statement Pt hasn't been seen for about 2 weeks again but has been steadily improving and continuing to wokr on her HEP as tolerated. Her drain sites are  healing well and saw Dr. Iran Planas today who removed all restrictions for pt now. Her PROM was much improved today as far as muscle tissue was softer and pt with less pain/tightness thorugout, she reported noting improvement with this as well. Progressed pts HEP to include strengthening today both in supine and standing and with strict instruction to not push any pain and to progress into all exercises at a slow pace increasing weights slowly and pt agreed. She is contemplating discharge soon so we decided to have her cancel appts for next week so she can continue with initial and  new HEP and we will reassess her in about 2 more weeks.    Pt will benefit from skilled therapeutic intervention in order to improve on the following deficits Decreased range of motion;Decreased knowledge of precautions;Pain;Decreased mobility;Impaired UE functional use   Rehab Potential Excellent   PT Frequency 2x / week   PT Duration 4 weeks   PT Treatment/Interventions Patient/family education;Therapeutic exercise;Manual techniques;Passive range of motion;ADLs/Self Care Home Management   PT Next Visit Plan Renewal done this visit. Assess pt at next visit how HEP is going. Cont PROM, pulleys, ball up wall, ROM exericises, review HEP prn.    Consulted and Agree with Plan of Care Patient        Problem List Patient Active Problem List   Diagnosis Date Noted  . 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 Genetic testing 07/03/2014  . Family history of breast cancer 07/03/2014  . Family history of cancer 07/03/2014  . Breast cancer, right breast 06/19/2014    Otelia Limes, PTA 08/28/2014, 5:06 PM  [place], [place], [address] Phone: [phone]   Fax:  [place], PT 08/28/2014 5:25 PM

## 2014-08-30 LAB — FUNGUS CULTURE W SMEAR: Fungal Smear: NONE SEEN

## 2014-09-02 ENCOUNTER — Telehealth: Payer: Self-pay | Admitting: Adult Health

## 2014-09-02 NOTE — Telephone Encounter (Signed)
Tara Fuller called me with questions concerning potential interactions between 2 different medications she would like to use to treat a vaginal yeast infection, while taking Tamoxifen.    She tells me that she had a Diflucan 150 mg tablet "left over" from her surgery, [when she was prescribed antibiotics and had oral thrush, and was prescribed 2 tabs of Diflucan. The thrush resolved after 1 tab].  She spoke with her local pharmacist who told her that the Diflucan and Tamoxifen were safe to take together, but she wanted to ask me to make sure.  For her current vaginal yeast infection, her OB/GYN called in Terazol 3 cream to help as well.  Tara Fuller was concerned that these 2 different medications may interact with her Tamoxifen and she wanted to make sure.    I looked up the Diflucan, Terazole, and Tamoxifen in [employer] for the interaction monograph.  There is no interaction between Terazole and Tamoxifen.  With regards to Diflucan and Tamoxifen, there is notation of CYP2C9 substrates (tamoxifen) being moderately affected.  However, I let her know that 1 dose of Diflucan would not negatively impact her "big picture" of chemoprevention with Tamoxifen and the potential benefit of Diflucan outweighed the risk.  She agreed and was reassured by this confirmation of what her pharmacy had told her.    I did encourage her to take the medications for vaginal yeast infection, but if the condition did not improve that she should consider making an appt with her PCP or OB/GYN to be evaluated (potentially for bacterial vaginosis) and be treated.  She agreed with this plan and offered appreciation for the call.  I encouraged her to call me anytime with questions or concerns and I would be happy to help, when able.   Mike Craze, NP [place] [phone]

## 2014-09-03 ENCOUNTER — Ambulatory Visit: Payer: No Typology Code available for payment source

## 2014-09-10 ENCOUNTER — Ambulatory Visit: Payer: No Typology Code available for payment source | Attending: Plastic Surgery

## 2014-09-10 DIAGNOSIS — M25611 Stiffness of right shoulder, not elsewhere classified: Secondary | ICD-10-CM | POA: Diagnosis present

## 2014-09-10 DIAGNOSIS — Z9011 Acquired absence of right breast and nipple: Secondary | ICD-10-CM | POA: Insufficient documentation

## 2014-09-10 NOTE — Therapy (Signed)
[place], [place], [address] Phone: [phone]   Fax:  [phone]  Physical Therapy Treatment  Patient Details  Name: Tara Fuller MRN: [id number] Date of Birth: [birth year] Referring Provider:  Irene Limbo, MD  Encounter Date: 09/10/2014      PT End of Session - 09/10/14 1102    Visit Number 5   Number of Visits 8   Date for PT Re-Evaluation 09/25/14   PT Start Time 1018   PT Stop Time 1102   PT Time Calculation (min) 44 min   Activity Tolerance Patient tolerated treatment well   Behavior During Therapy [facility] for tasks assessed/performed      Past Medical History  Diagnosis Date  . History of cardiac murmur     states had echo many years ago; no know problems  . 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 Low grade fever 08/01/2014    100.2 has been the highest, per pt.  Marland Kitchen Spinal headache     after C-section  . Dental crowns present     x 2 - left upper back teeth    Past Surgical History  Procedure Laterality Date  . Cesarean section  2008  . Mastectomy w/ sentinel node biopsy  07/09/2014    NIPPLE SPARING  WITH RECONSTRUCTION  . Wisdom tooth extraction    . Nipple sparing mastectomy/sentinal lymph node biopsy/reconstruction/placement of tissue expander Right 07/09/2014    Procedure: RIGHT NIPPLE SPARING MASTECTOMY WITH RIGHT AXILLARY  SENTINAL LYMPH NODE BIOPSY;  Surgeon: Rolm Bookbinder, MD;  Location: [place];  Service: General;  Laterality: Right;  . Breast reconstruction with placement of tissue expander and flex hd (acellular hydrated dermis) Right 07/09/2014    Procedure: RIGHT BREAST RECONSTRUCTION WITH PLACEMENT OF TISSUE EXPANDER AND ALLODERM;  Surgeon: Irene Limbo, MD;  Location: [place];  Service: Plastics;  Laterality: Right;  . Tissue expander placement Right 08/02/2014    Procedure: REMOVAL AND PLACEMENT OF RIGHT TISSUE EXPANDER ;  Surgeon: Irene Limbo, MD;  Location: [place];  Service: Plastics;  Laterality: Right;    There were no vitals filed for this visit.  Visit Diagnosis:  H/O right mastectomy  Shoulder stiffness, right      Subjective Assessment - 09/10/14 1021    Subjective Been doing really good with exercising at home, only have a little bit of tightness at my end ROM now which continues to improve. Feel ready to discharge.    Currently in Pain? No/denies            [facility] PT Assessment - 09/10/14 0001    AROM   Right Shoulder Flexion 153 Degrees   Right Shoulder ABduction 161 Degrees                     [facility] Adult PT Treatment/Exercise - 09/10/14 0001    Shoulder Exercises: Pulleys   Flexion 1 minute   ABduction 1 minute   Shoulder Exercises: Therapy Ball   Flexion 5 reps  Roll yellow ball up wall   ABduction 5 reps  Roll yellow ball up wall   Shoulder Exercises: ROM/Strengthening   Other ROM/Strengthening Exercises Instructed pt in Strength ABC Program: Bil chest, shoulder, tricep, calf, quadriceps, seated hamstring and piriofrmis figure 4, and supine low trunk rotation all 1 rep for 15 sec.    Other ROM/Strengthening Exercises Strength: Supine bridging and then chest press with 2 lbs,  10 reps each; bil S/L clam, quadruped Superwoman, standing squats, one arm row with chair 2 lbs, scaption against wall with 2 lbs, bil hip abduction, 6" step ups,  5 reps each                PT Education - 09/10/14 1102    Education provided Yes   Education Details Strength ABC Program   Person(s) Educated Patient   Methods Explanation;Demonstration;Verbal cues   Comprehension Returned demonstration;Verbalized understanding                [facility] Goals - 09/10/14 1142    CC Long Term Goal  #1   Title Patient will be able to perform home exercise program correctly and independently to increase ROM.   Status Achieved   CC Long Term Goal  #2   Title  Patient will be able to increase right shoulder active flexion to >/= 130 degrees for increased ease reaching overhead  153 degrees attained today 09/10/14   Status Achieved   CC Long Term Goal  #3   Title Patient will be able to increase right shoulder active abduction to >/= 130 degrees for increased ease reaching overhead  163 degrees attained today 09/10/14   Status Achieved   CC Long Term Goal  #4   Title Patient will report she is able to dry her hair with >/= 50% less difficulty  No problems with this now.   CC Long Term Goal  #5   Title Patient will be able to return to her gym doing gentle exercise and will verbalize understanding of how to progress to prior level of exercise  Issued Strength ABC Program today and pt walking 5-6 days/week for cardio   Status Partially Met            Plan - 09/10/14 1103    Clinical Impression Statement Today ended up being pts last visit as she is doing great and has met all her goals except gym exercises as she has yet to return to the gym. Her ROM is now > Lt UE. Pt did well with instruction of Strength ABC Program and reports feeling good after treatment today. Pt ready to discharge to HEP at this time. Is planning to sign up for ABC class and to call Earlie Counts, PT for pelvic pain class.   Pt will benefit from skilled therapeutic intervention in order to improve on the following deficits Decreased range of motion;Decreased knowledge of precautions;Pain;Decreased mobility;Impaired UE functional use   Rehab Potential Excellent   Clinical Impairments Affecting Rehab Potential None   PT Frequency 2x / week   PT Treatment/Interventions Patient/family education;Therapeutic exercise;Manual techniques;Passive range of motion;ADLs/Self Care Home Management   PT Next Visit Plan Discharge this visit.    PT Home Exercise Plan Strength ABC Program   Consulted and Agree with Plan of Care Patient        Problem List Patient Active Problem List    Diagnosis Date Noted  . 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 Genetic testing 07/03/2014  . Family history of breast cancer 07/03/2014  . Family history of cancer 07/03/2014  . Breast cancer, right breast 06/19/2014    Otelia Limes, PTA 09/10/2014, 11:50 AM  [place] [place] [address] [place], [place], [address] Phone: [phone]   Fax:  [phone]  PHYSICAL THERAPY DISCHARGE SUMMARY  Visits from Start of Care: 5  Current functional level related to goals / functional  outcomes: Goals met except being able to return to exercising at the gym but that is due to other factors.  She is ready to return to the gym at low intensity levels.   Remaining deficits: None.  Her shoulder ROM is actually better than her left shoulder!   Education / Equipment: Compression sleeve for flying.  Plan: Patient agrees to discharge.  Patient goals were met. Patient is being discharged due to meeting the stated rehab goals.  ?????       Annia Friendly, [place] 09/11/2014 9:19 AM

## 2014-09-11 ENCOUNTER — Encounter: Payer: Self-pay | Admitting: Adult Health

## 2014-09-11 NOTE — Progress Notes (Signed)
The Survivorship Care Plan was mailed to Tara Fuller as she reported not being able to come in to the Survivorship Clinic for an in-person visit at this time. A letter was mailed to her outlining the purpose of the content of the care plan, as well as encouraging her to reach out to me with any questions or concerns.  My business card was included in the correspondence to the patient as well.  A copy of the care plan was also routed/faxed/mailed to Tara Einstein, MD, the patient's PCP.  I will not be placing any follow-up appointments to the Survivorship Clinic for Tara Fuller, but I am happy to see her at any time in the future for any survivorship concerns that may arise. Thank you for allowing me to participate in her care!  Mike Craze, NP [place] [phone]

## 2014-09-13 ENCOUNTER — Telehealth: Payer: Self-pay | Admitting: Adult Health

## 2014-09-13 NOTE — Telephone Encounter (Signed)
Ms. Biehler called me this morning with concerns regarding her continued issues with a vaginal yeast infection.  She tells me that her GYN saw her and performed a wet prep, which did show yeast.  But, she is still concerned that after 3 doses of Diflucan, anti-fungal cream, and external anti-fungal/steroid cream, her labia are "still very red and I'm worried about it."  She tells me that overall the itching and redness is improved, but she is largely concerned about the internal labia redness that she describes. Her GYN instructed her to continue to use the anti-fungal/steroid cream externally BID through the weekend to see if her symptoms improve, but she called me because she wanted to "make sure it was okay and you always help me feel better."    I let her know that it sounds like overall, her symptoms are improving and it would not be uncommon for her to still have some labial redness after a vaginal yeast infection.  I encouraged her to wear loose fitting clothes, cotton underwear, and abstain from sexual intercourse until she has healed. I also encouraged her to shower immediately after working out to prevent worsening symptoms.  She was again concerned that this cream may affect her Tamoxifen, and I reassured her that it would not and these 2 medicines are safe to take together.    She voiced appreciation for helping her feel better and reassured.  I encouraged her to call me anytime and I would be happy to help in any way I can.    Mike Craze, NP [place] [phone]

## 2014-09-15 LAB — AFB CULTURE WITH SMEAR (NOT AT ARMC): Acid Fast Smear: NONE SEEN

## 2014-09-17 ENCOUNTER — Ambulatory Visit: Payer: No Typology Code available for payment source

## 2014-09-24 ENCOUNTER — Ambulatory Visit: Payer: No Typology Code available for payment source

## 2014-09-24 ENCOUNTER — Telehealth: Payer: Self-pay | Admitting: Hematology and Oncology

## 2014-09-24 ENCOUNTER — Ambulatory Visit (HOSPITAL_BASED_OUTPATIENT_CLINIC_OR_DEPARTMENT_OTHER): Payer: No Typology Code available for payment source | Admitting: Hematology and Oncology

## 2014-09-24 ENCOUNTER — Encounter: Payer: Self-pay | Admitting: Hematology and Oncology

## 2014-09-24 VITALS — BP 135/89 | HR 87 | Temp 98.1°F | Resp 18 | Ht 63.0 in | Wt 108.5 lb

## 2014-09-24 DIAGNOSIS — C50911 Malignant neoplasm of unspecified site of right female breast: Secondary | ICD-10-CM

## 2014-09-24 NOTE — Assessment & Plan Note (Signed)
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 Breast MRI revealed 3.8 x 2.4 x 1.7 cm postbiopsy hematoma right breast centered at 6:00 position Right mastectomy 07/09/2014: DCIS with calcifications intermediate grade 0.6 cm 0/5 lymph nodes negative ([facility] ROR 12% and with tamoxifen 3%)  Tamoxifen toxicities:  Return to clinic in 3 months for follow-up

## 2014-09-24 NOTE — Telephone Encounter (Signed)
Appointments made and avs printed for pateint °

## 2014-09-24 NOTE — Progress Notes (Signed)
Patient Care Team: Karlene Einstein, MD as PCP - General (Family Medicine)  DIAGNOSIS: Breast cancer, right breast   Staging form: Breast, AJCC 7th Edition     Clinical: Stage 0 (Tis (DCIS), N0, M0) - Unsigned     Pathologic: Stage 0 (Tis (DCIS), N0, cM0) - Unsigned   SUMMARY OF ONCOLOGIC HISTORY:   Breast cancer, right breast   06/07/2014 Initial Diagnosis Right breast biopsy (LOQ): DCIS with calcifications, low to intermediate grade, ER 90%, PR 30%   06/18/2014 Miscellaneous Genetic testing negative. Genes tested include: ATM, BARD1, BRCA1/2, BRIP1, CDH1, CHEK2, EPCAM, FANCC, MLH1, MSH2, MSH6, NBN, PALB2, PMS2, PTEN, RAD51C, RAD51D, STK11, TP53, and XRCC2.    06/18/2014 Breast MRI Right breast: 3.8 cm post biopsy hematoma centered in the 6 o'clock position of the right breast, posteriorly   07/09/2014 Surgery Right mastectomy with SLNB Donne Hazel): DCIS with calcifications intermediate grade 0.6 cm 0/5 lymph nodes negative   07/09/2014 Pathologic Stage pTis, pN0: Stage 0   08/2014 -  Anti-estrogen oral therapy Tamoxifen daily. Planned duration of treatment 5 years [place]).    09/11/2014 Survivorship Survivorship care plan mailed to pt.     CHIEF COMPLIANT: Follow-up on tamoxifen   INTERVAL HISTORY: Tara Fuller is a 39 year old with above-mentioned history of right breast DCIS status post mastectomy and is now on tamoxifen therapy. She is tolerating tamoxifen extremely well. Denies any hot flashes or myalgias. The tissue expander is in place and is planning to get her replacement in November 2016. She is very busy with her children playing sports. Previously she had a problem with an infection of the expander.  REVIEW OF SYSTEMS:   Constitutional: Denies fevers, chills or abnormal weight loss Eyes: Denies blurriness of vision Ears, nose, mouth, throat, and face: Denies mucositis or sore throat Respiratory: Denies cough, dyspnea or wheezes Cardiovascular: Denies palpitation, chest discomfort or  lower extremity swelling Gastrointestinal:  Denies nausea, heartburn or change in bowel habits Skin: Denies abnormal skin rashes Lymphatics: Denies new lymphadenopathy or easy bruising Neurological:Denies numbness, tingling or new weaknesses Behavioral/Psych: Mood is stable, no new changes   All other systems were reviewed with the patient and are negative.  I have reviewed the past medical history, past surgical history, social history and family history with the patient and they are unchanged from previous note.  ALLERGIES:  has No Known Allergies.  MEDICATIONS:  Current Outpatient Prescriptions  Medication Sig Dispense Refill  . tamoxifen (NOLVADEX) 20 MG tablet Take 1 tablet (20 mg total) by mouth daily. 90 tablet 3   No current facility-administered medications for this visit.    PHYSICAL EXAMINATION: ECOG PERFORMANCE STATUS: 1 - Symptomatic but completely ambulatory  Filed Vitals:   09/24/14 1000  BP: 135/89  Pulse: 87  Temp: 98.1 F (36.7 C)  Resp: 18   Filed Weights   09/24/14 1000  Weight: 108 lb 8 oz (49.215 kg)    GENERAL:alert, no distress and comfortable SKIN: skin color, texture, turgor are normal, no rashes or significant lesions EYES: normal, Conjunctiva are pink and non-injected, sclera clear OROPHARYNX:no exudate, no erythema and lips, buccal mucosa, and tongue normal  NECK: supple, thyroid normal size, non-tender, without nodularity LYMPH:  no palpable lymphadenopathy in the cervical, axillary or inguinal LUNGS: clear to auscultation and percussion with normal breathing effort HEART: regular rate & rhythm and no murmurs and no lower extremity edema ABDOMEN:abdomen soft, non-tender and normal bowel sounds Musculoskeletal:no cyanosis of digits and no clubbing  NEURO: alert & oriented x  3 with fluent speech, no focal motor/sensory deficits  LABORATORY DATA:  I have reviewed the data as listed   Chemistry      Component Value Date/Time   NA 139  07/05/2014 1520   K 4.3 07/05/2014 1520   CL 105 07/05/2014 1520   CO2 27 07/05/2014 1520   BUN 13 07/05/2014 1520   CREATININE 0.83 07/05/2014 1520      Component Value Date/Time   CALCIUM 9.2 07/05/2014 1520       Lab Results  Component Value Date   WBC 7.1 07/05/2014   HGB 15.4* 08/02/2014   HCT 41.1 07/05/2014   MCV 88.2 07/05/2014   PLT 164 07/05/2014   NEUTROABS 4.6 07/05/2014   ASSESSMENT & PLAN:  Breast cancer, right breast Right breast DCIS low to intermediate grade ER 80% PR 30% positive Tis N0 M0 stage 0 diagnosed 06/07/2014. Breast MRI revealed 3.8 x 2.4 x 1.7 cm postbiopsy hematoma right breast centered at 6:00 position Right mastectomy 07/09/2014: DCIS with calcifications intermediate grade 0.6 cm 0/5 lymph nodes negative ([facility] ROR 12% and with tamoxifen 3%)  Tamoxifen toxicities: Denies any hot flashes or myalgias Tolerating it extremely well  Breast cancer surveillance: Mammogram is to be done in June 2017 Patient is getting breast reconstruction. Likely implant placement in November.  Return to clinic in 6 months for follow-up   No orders of the defined types were placed in this encounter.   The patient has a good understanding of the overall plan. she agrees with it. she will call with any problems that may develop before the next visit here.   Rulon Eisenmenger, MD     Resolving TREATMENT for her

## 2014-10-05 DIAGNOSIS — N651 Disproportion of reconstructed breast: Secondary | ICD-10-CM

## 2014-10-05 HISTORY — DX: Disproportion of reconstructed breast: N65.1

## 2014-10-30 NOTE — H&P (Signed)
  Subjective:    [name] is a 39 y.o. female.  Follow-up  3 months post op right NSM, TE/ADM reconstruction and 2 months post remove/replace tissue expander for cellulitis and seroma. Plan implant exchange surgery.  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 Patient diagnosed with DCIS Right breast, ER/PR + following screening MMG showing change in calcifications. MRI with no masses or abnormal enhancement bilateral. Calcifications spanned 5 cm. Two aunts with breast ca,genetics negative.   Prior 32/34 A cup. Mastectomy wt 150 g  Review of Systems     Objective:   Physical Exam  Cardiovascular: Normal rate, regular rhythm and normal heart sounds.  Pulmonary/Chest: Effort normal and breath sounds normal.  Abdominal: Soft.   Scars maturing, right IMF and NAC higher than left Left breast no masses SN to nipple R 16 L 18.5 BW R 11 L 11 cm Nipple to IMF R 6 L 6  Left breast height 13 cm    Assessment:     DCIS R breast S/p NSM, TE/ADM reconstruction. S/p removal/replacement TE for seroma    Plan:    Plan second stage reconstruction with removal expander and placement implant. She has elected for anatomic silicone implants and agrees to left breast augmentation for symmetry.  Plan OP surgery, discussed drains on each side, post op limitations and compression, expected pain. She was given implant booklets last visit and understands risks rupture, rotation, contracture, MRI surveillance for rupture, risk infection, need for replacement. Discussed additional risks of rippling, seroma, hematoma, asymmetry, DVT/PE, damage to deeper structures reviewed.   Mentor Artoura High Profile 225 ml tissue expander,  160 ml total fill volume     Irene Limbo, MD [facility] Plastic & Reconstructive Surgery [phone]

## 2014-11-01 ENCOUNTER — Encounter (HOSPITAL_BASED_OUTPATIENT_CLINIC_OR_DEPARTMENT_OTHER): Payer: Self-pay | Admitting: *Deleted

## 2014-11-08 ENCOUNTER — Ambulatory Visit (HOSPITAL_BASED_OUTPATIENT_CLINIC_OR_DEPARTMENT_OTHER): Payer: No Typology Code available for payment source | Admitting: Anesthesiology

## 2014-11-08 ENCOUNTER — Encounter (HOSPITAL_BASED_OUTPATIENT_CLINIC_OR_DEPARTMENT_OTHER): Payer: Self-pay | Admitting: *Deleted

## 2014-11-08 ENCOUNTER — Ambulatory Visit (HOSPITAL_BASED_OUTPATIENT_CLINIC_OR_DEPARTMENT_OTHER)
Admission: RE | Admit: 2014-11-08 | Discharge: 2014-11-08 | Disposition: A | Discharge status: Home / Self Care | Payer: No Typology Code available for payment source | Source: Ambulatory Visit | Attending: Plastic Surgery | Admitting: Plastic Surgery

## 2014-11-08 ENCOUNTER — Encounter (HOSPITAL_BASED_OUTPATIENT_CLINIC_OR_DEPARTMENT_OTHER)
Admission: RE | Disposition: A | Discharge status: Home / Self Care | Payer: Self-pay | Source: Ambulatory Visit | Attending: Plastic Surgery

## 2014-11-08 DIAGNOSIS — N651 Disproportion of reconstructed breast: Secondary | ICD-10-CM | POA: Diagnosis not present

## 2014-11-08 DIAGNOSIS — Z853 Personal history of malignant neoplasm of breast: Secondary | ICD-10-CM | POA: Insufficient documentation

## 2014-11-08 DIAGNOSIS — Z421 Encounter for breast reconstruction following mastectomy: Secondary | ICD-10-CM | POA: Diagnosis present

## 2014-11-08 DIAGNOSIS — Z9011 Acquired absence of right breast and nipple: Secondary | ICD-10-CM | POA: Diagnosis not present

## 2014-11-08 HISTORY — PX: AUGMENTATION MAMMAPLASTY: SUR837

## 2014-11-08 HISTORY — PX: BREAST ENHANCEMENT SURGERY: SHX7

## 2014-11-08 HISTORY — DX: Disproportion of reconstructed breast: N65.1

## 2014-11-08 HISTORY — PX: REMOVAL OF TISSUE EXPANDER AND PLACEMENT OF IMPLANT: SHX6457

## 2014-11-08 HISTORY — DX: Personal history of in-situ neoplasm of breast: Z86.000

## 2014-11-08 HISTORY — DX: Adverse effect of unspecified anesthetic, initial encounter: T41.45XA

## 2014-11-08 HISTORY — DX: Other complications of anesthesia, initial encounter: T88.59XA

## 2014-11-08 LAB — POCT HEMOGLOBIN-HEMACUE: Hemoglobin: 14.6 g/dL (ref 12.0–15.0)

## 2014-11-08 SURGERY — REMOVAL, TISSUE EXPANDER, BREAST, WITH IMPLANT INSERTION
Anesthesia: General | Site: Breast | Laterality: Right

## 2014-11-08 MED ORDER — FENTANYL CITRATE (PF) 100 MCG/2ML IJ SOLN
INTRAMUSCULAR | Status: AC
  Filled 2014-11-08: qty 4

## 2014-11-08 MED ORDER — DIAZEPAM 5 MG PO TABS: 2.5000 mg | ORAL_TABLET | Freq: Three times a day (TID) | ORAL | Status: DC | PRN

## 2014-11-08 MED ORDER — ACETAMINOPHEN 10 MG/ML IV SOLN
1000.0000 mg | Freq: Once | INTRAVENOUS | Status: AC
  Administered 2014-11-08: 1000 mg via INTRAVENOUS

## 2014-11-08 MED ORDER — ONDANSETRON HCL 4 MG/2ML IJ SOLN
INTRAMUSCULAR | Status: AC
  Filled 2014-11-08: qty 2

## 2014-11-08 MED ORDER — CEFAZOLIN SODIUM-DEXTROSE 2-3 GM-% IV SOLR
2.0000 g | INTRAVENOUS | Status: AC
  Administered 2014-11-08: 2 g via INTRAVENOUS

## 2014-11-08 MED ORDER — LIDOCAINE HCL (CARDIAC) 20 MG/ML IV SOLN
INTRAVENOUS | Status: AC
  Filled 2014-11-08: qty 5

## 2014-11-08 MED ORDER — CHLORHEXIDINE GLUCONATE 4 % EX LIQD: 1.0000 "application " | Freq: Once | CUTANEOUS | Status: DC

## 2014-11-08 MED ORDER — ARTIFICIAL TEARS OP OINT
TOPICAL_OINTMENT | OPHTHALMIC | Status: AC
  Filled 2014-11-08: qty 3.5

## 2014-11-08 MED ORDER — MEPERIDINE HCL 25 MG/ML IJ SOLN
6.2500 mg | INTRAMUSCULAR | Status: DC | PRN
Start: 2014-11-08 — End: 2014-11-08

## 2014-11-08 MED ORDER — PROPOFOL 500 MG/50ML IV EMUL
INTRAVENOUS | Status: AC
  Filled 2014-11-08: qty 50

## 2014-11-08 MED ORDER — PROMETHAZINE HCL 25 MG/ML IJ SOLN: 6.2500 mg | INTRAMUSCULAR | Status: DC | PRN

## 2014-11-08 MED ORDER — HYDROMORPHONE HCL 1 MG/ML IJ SOLN
INTRAMUSCULAR | Status: AC
  Filled 2014-11-08: qty 1

## 2014-11-08 MED ORDER — LACTATED RINGERS IV SOLN
INTRAVENOUS | Status: DC
  Administered 2014-11-08 (×3): via INTRAVENOUS

## 2014-11-08 MED ORDER — FENTANYL CITRATE (PF) 100 MCG/2ML IJ SOLN
50.0000 ug | INTRAMUSCULAR | Status: AC | PRN
  Administered 2014-11-08 (×4): 50 ug via INTRAVENOUS

## 2014-11-08 MED ORDER — SULFAMETHOXAZOLE-TRIMETHOPRIM 800-160 MG PO TABS: 1.0000 | ORAL_TABLET | Freq: Two times a day (BID) | ORAL | Status: DC

## 2014-11-08 MED ORDER — ACETAMINOPHEN 10 MG/ML IV SOLN
INTRAVENOUS | Status: AC
  Filled 2014-11-08: qty 100

## 2014-11-08 MED ORDER — PROPOFOL 10 MG/ML IV BOLUS
INTRAVENOUS | Status: DC | PRN
  Administered 2014-11-08: 150 mg via INTRAVENOUS
  Administered 2014-11-08: 50 mg via INTRAVENOUS

## 2014-11-08 MED ORDER — ONDANSETRON HCL 4 MG/2ML IJ SOLN
INTRAMUSCULAR | Status: DC | PRN
  Administered 2014-11-08: 4 mg via INTRAVENOUS

## 2014-11-08 MED ORDER — ARTIFICIAL TEARS OP OINT
TOPICAL_OINTMENT | OPHTHALMIC | Status: DC | PRN
  Administered 2014-11-08: 1 via OPHTHALMIC

## 2014-11-08 MED ORDER — ONDANSETRON HCL 4 MG/2ML IJ SOLN
4.0000 mg | Freq: Once | INTRAMUSCULAR | Status: AC
  Administered 2014-11-08: 4 mg via INTRAVENOUS

## 2014-11-08 MED ORDER — LIDOCAINE HCL (CARDIAC) 20 MG/ML IV SOLN
INTRAVENOUS | Status: DC | PRN
  Administered 2014-11-08: 60 mg via INTRAVENOUS

## 2014-11-08 MED ORDER — GLYCOPYRROLATE 0.2 MG/ML IJ SOLN: 0.2000 mg | Freq: Once | INTRAMUSCULAR | Status: DC | PRN

## 2014-11-08 MED ORDER — HYDROMORPHONE HCL 1 MG/ML IJ SOLN
0.5000 mg | INTRAMUSCULAR | Status: AC | PRN
  Administered 2014-11-08 (×2): 0.5 mg via INTRAVENOUS

## 2014-11-08 MED ORDER — SUCCINYLCHOLINE CHLORIDE 20 MG/ML IJ SOLN
INTRAMUSCULAR | Status: AC
  Filled 2014-11-08: qty 1

## 2014-11-08 MED ORDER — SUCCINYLCHOLINE CHLORIDE 20 MG/ML IJ SOLN
INTRAMUSCULAR | Status: DC | PRN
  Administered 2014-11-08: 100 mg via INTRAVENOUS

## 2014-11-08 MED ORDER — SODIUM CHLORIDE 0.9 % IV SOLN
INTRAVENOUS | Status: DC | PRN
  Administered 2014-11-08: 1000 mL

## 2014-11-08 MED ORDER — TRAMADOL HCL 50 MG PO TABS: 50.0000 mg | ORAL_TABLET | Freq: Four times a day (QID) | ORAL | Status: DC | PRN

## 2014-11-08 MED ORDER — MIDAZOLAM HCL 2 MG/2ML IJ SOLN
1.0000 mg | INTRAMUSCULAR | Status: DC | PRN
  Administered 2014-11-08: 2 mg via INTRAVENOUS

## 2014-11-08 MED ORDER — SCOPOLAMINE 1 MG/3DAYS TD PT72: 1.0000 | MEDICATED_PATCH | Freq: Once | TRANSDERMAL | Status: DC | PRN

## 2014-11-08 MED ORDER — HYDROMORPHONE HCL 1 MG/ML IJ SOLN
0.2500 mg | INTRAMUSCULAR | Status: DC | PRN
  Administered 2014-11-08 (×4): 0.5 mg via INTRAVENOUS

## 2014-11-08 MED ORDER — DEXAMETHASONE SODIUM PHOSPHATE 4 MG/ML IJ SOLN
INTRAMUSCULAR | Status: DC | PRN
  Administered 2014-11-08: 10 mg via INTRAVENOUS

## 2014-11-08 MED ORDER — CEFAZOLIN SODIUM-DEXTROSE 2-3 GM-% IV SOLR
INTRAVENOUS | Status: AC
  Filled 2014-11-08: qty 50

## 2014-11-08 MED ORDER — MIDAZOLAM HCL 2 MG/2ML IJ SOLN
INTRAMUSCULAR | Status: AC
  Filled 2014-11-08: qty 4

## 2014-11-08 SURGICAL SUPPLY — 86 items
BAG DECANTER FOR FLEXI CONT (MISCELLANEOUS) ×4 IMPLANT
BANDAGE ELASTIC 6 VELCRO ST LF (GAUZE/BANDAGES/DRESSINGS) IMPLANT
BINDER BREAST LRG (GAUZE/BANDAGES/DRESSINGS) IMPLANT
BINDER BREAST MEDIUM (GAUZE/BANDAGES/DRESSINGS) ×4 IMPLANT
BINDER BREAST XLRG (GAUZE/BANDAGES/DRESSINGS) IMPLANT
BINDER BREAST XXLRG (GAUZE/BANDAGES/DRESSINGS) IMPLANT
BLADE SURG 15 STRL LF DISP TIS (BLADE) ×2 IMPLANT
BLADE SURG 15 STRL SS (BLADE) ×2
BNDG GAUZE ELAST 4 BULKY (GAUZE/BANDAGES/DRESSINGS) ×8 IMPLANT
CANISTER SUCT 1200ML W/VALVE (MISCELLANEOUS) ×4 IMPLANT
CHLORAPREP W/TINT 26ML (MISCELLANEOUS) ×4 IMPLANT
CLOSURE WOUND 1/2 X4 (GAUZE/BANDAGES/DRESSINGS)
COVER BACK TABLE 60X90IN (DRAPES) IMPLANT
COVER MAYO STAND STRL (DRAPES) IMPLANT
DECANTER SPIKE VIAL GLASS SM (MISCELLANEOUS) IMPLANT
DRAIN CHANNEL 15F RND FF W/TCR (WOUND CARE) ×4 IMPLANT
DRAPE LAPAROSCOPIC ABDOMINAL (DRAPES) IMPLANT
DRSG PAD ABDOMINAL 8X10 ST (GAUZE/BANDAGES/DRESSINGS) ×8 IMPLANT
DRSG TEGADERM 2-3/8X2-3/4 SM (GAUZE/BANDAGES/DRESSINGS) ×8 IMPLANT
ELECT BLADE 4.0 EZ CLEAN MEGAD (MISCELLANEOUS) ×4
ELECT BLADE 6.5 .24CM SHAFT (ELECTRODE) IMPLANT
ELECT COATED BLADE 2.86 ST (ELECTRODE) ×4 IMPLANT
ELECT REM PT RETURN 9FT ADLT (ELECTROSURGICAL) ×4
ELECTRODE BLDE 4.0 EZ CLN MEGD (MISCELLANEOUS) ×2 IMPLANT
ELECTRODE REM PT RTRN 9FT ADLT (ELECTROSURGICAL) ×2 IMPLANT
EVACUATOR SILICONE 100CC (DRAIN) ×4 IMPLANT
GAUZE SPONGE 4X4 12PLY STRL (GAUZE/BANDAGES/DRESSINGS) IMPLANT
GLOVE BIO SURGEON STRL SZ 6 (GLOVE) ×8 IMPLANT
GLOVE BIO SURGEON STRL SZ 6.5 (GLOVE) IMPLANT
GLOVE BIO SURGEONS STRL SZ 6.5 (GLOVE)
GLOVE BIOGEL PI IND STRL 7.0 (GLOVE) ×2 IMPLANT
GLOVE BIOGEL PI INDICATOR 7.0 (GLOVE) ×2
GLOVE ECLIPSE 6.5 STRL STRAW (GLOVE) ×4 IMPLANT
GLOVE EXAM NITRILE EXT CUFF MD (GLOVE) ×4 IMPLANT
GOWN STRL REUS W/ TWL LRG LVL3 (GOWN DISPOSABLE) ×4 IMPLANT
GOWN STRL REUS W/TWL LRG LVL3 (GOWN DISPOSABLE) ×4
IMPLANT BREAST GEL 120CC (Breast) ×4 IMPLANT
IMPLANT SILCONE 255CC (Breast) ×4 IMPLANT
IV NS 1000ML (IV SOLUTION)
IV NS 1000ML BAXH (IV SOLUTION) IMPLANT
IV NS 500ML (IV SOLUTION)
IV NS 500ML BAXH (IV SOLUTION) IMPLANT
KIT FILL SYSTEM UNIVERSAL (SET/KITS/TRAYS/PACK) IMPLANT
LIQUID BAND (GAUZE/BANDAGES/DRESSINGS) ×4 IMPLANT
MANIFOLD NEPTUNE II (INSTRUMENTS) IMPLANT
NDL SAFETY ECLIPSE 18X1.5 (NEEDLE) IMPLANT
NEEDLE HYPO 18GX1.5 SHARP (NEEDLE)
NEEDLE HYPO 25X1 1.5 SAFETY (NEEDLE) IMPLANT
NS IRRIG 1000ML POUR BTL (IV SOLUTION) ×4 IMPLANT
PACK BASIN DAY SURGERY FS (CUSTOM PROCEDURE TRAY) ×4 IMPLANT
PACK UNIVERSAL I (CUSTOM PROCEDURE TRAY) ×4 IMPLANT
PENCIL BUTTON HOLSTER BLD 10FT (ELECTRODE) ×4 IMPLANT
PIN SAFETY STERILE (MISCELLANEOUS) ×4 IMPLANT
SHEET MEDIUM DRAPE 40X70 STRL (DRAPES) ×8 IMPLANT
SIZER BREAST GENERIC MENTOR (SIZER) ×8 IMPLANT
SIZER BREAST REUSE 120CC (SIZER) ×4 IMPLANT
SIZER BREAST REUSE 255CC (SIZER) ×4 IMPLANT
SIZER BREAST REUSE 295CC (SIZER) ×4
SIZER BREAST SILICONE GENERIC (SIZER) ×4 IMPLANT
SIZER BRST REUSE 295CC MSHAPE (SIZER) ×2 IMPLANT
SLEEVE SCD COMPRESS KNEE MED (MISCELLANEOUS) ×4 IMPLANT
SPONGE GAUZE 4X4 12PLY STER LF (GAUZE/BANDAGES/DRESSINGS) IMPLANT
SPONGE LAP 18X18 X RAY DECT (DISPOSABLE) ×12 IMPLANT
STAPLER VISISTAT 35W (STAPLE) ×4 IMPLANT
STRIP CLOSURE SKIN 1/2X4 (GAUZE/BANDAGES/DRESSINGS) IMPLANT
SUT ETHILON 2 0 FS 18 (SUTURE) ×8 IMPLANT
SUT MNCRL AB 4-0 PS2 18 (SUTURE) ×4 IMPLANT
SUT MON AB 3-0 SH 27 (SUTURE)
SUT MON AB 3-0 SH27 (SUTURE) IMPLANT
SUT PDS 3-0 CT2 (SUTURE)
SUT PDS AB 2-0 CT2 27 (SUTURE) IMPLANT
SUT PDS II 3-0 CT2 27 ABS (SUTURE) IMPLANT
SUT VIC AB 3-0 PS1 18 (SUTURE)
SUT VIC AB 3-0 PS1 18XBRD (SUTURE) IMPLANT
SUT VIC AB 3-0 SH 27 (SUTURE) ×2
SUT VIC AB 3-0 SH 27X BRD (SUTURE) ×2 IMPLANT
SUT VICRYL 4-0 PS2 18IN ABS (SUTURE) ×4 IMPLANT
SYR 50ML LL SCALE MARK (SYRINGE) IMPLANT
SYR BULB IRRIGATION 50ML (SYRINGE) ×4 IMPLANT
SYR CONTROL 10ML LL (SYRINGE) IMPLANT
SYRINGE 10CC LL (SYRINGE) IMPLANT
TOWEL OR 17X24 6PK STRL BLUE (TOWEL DISPOSABLE) ×8 IMPLANT
TUBE CONNECTING 20'X1/4 (TUBING) ×1
TUBE CONNECTING 20X1/4 (TUBING) ×3 IMPLANT
UNDERPAD 30X30 (UNDERPADS AND DIAPERS) ×8 IMPLANT
YANKAUER SUCT BULB TIP NO VENT (SUCTIONS) ×4 IMPLANT

## 2014-11-08 NOTE — Anesthesia Preprocedure Evaluation (Addendum)
Anesthesia Evaluation  Patient identified by MRN, date of birth, ID band Patient awake    Reviewed: Allergy & Precautions, H&P , NPO status , Patient's Chart, lab work & pertinent test results  Airway Mallampati: I  TM Distance: >3 FB Neck ROM: Full    Dental no notable dental hx. (+) Teeth Intact, Dental Advisory Given   Pulmonary neg pulmonary ROS,    Pulmonary exam normal breath sounds clear to auscultation       Cardiovascular negative cardio ROS   Rhythm:Regular Rate:Normal     Neuro/Psych  Headaches, negative neurological ROS  negative psych ROS   GI/Hepatic negative GI ROS, Neg liver ROS,   Endo/Other  negative endocrine ROS  Renal/GU negative Renal ROS  negative genitourinary   Musculoskeletal   Abdominal   Peds  Hematology negative hematology ROS (+)   Anesthesia Other Findings   Reproductive/Obstetrics negative OB ROS                             Anesthesia Physical  Anesthesia Plan  ASA: II  Anesthesia Plan: General   Post-op Pain Management:    Induction: Intravenous  Airway Management Planned: LMA  Additional Equipment:   Intra-op Plan:   Post-operative Plan: Extubation in OR  Informed Consent: I have reviewed the patients History and Physical, chart, labs and discussed the procedure including the risks, benefits and alternatives for the proposed anesthesia with the patient or authorized representative who has indicated his/her understanding and acceptance.   Dental advisory given  Plan Discussed with: CRNA  Anesthesia Plan Comments:         Anesthesia Quick Evaluation

## 2014-11-08 NOTE — Op Note (Signed)
Operative Note   DATE OF OPERATION: 11.4.2016  LOCATION: [facility]- outpatient  SURGICAL DIVISION: Plastic Surgery  PREOPERATIVE DIAGNOSES:  1. Acquired absence right breast 2. History breast cancer 3. Asymmetry native and reconstructed breast  POSTOPERATIVE DIAGNOSES:  same  PROCEDURE:  1. Removal right breast tissue expander and placement anatomic silicone implant 2. Left breast augmentation for symmetry with anatomic silicone implant  SURGEON: Irene Limbo MD MBA  ASSISTANT: none  ANESTHESIA:  General.   EBL: 50 ml  COMPLICATIONS: None immediate.   INDICATIONS FOR PROCEDURE:  The patient, Tara Fuller, is a 39 y.o. female born on 1975-03-10, is here for second stage breast reconstruction following nipple sparing mastectomy and immediate reconstruction with tissue expander   FINDINGS: RIGHT breast Mentor MemoryShape Medium Height Moderate Plus Profile 255 ml Ref [phone] SN [id number]. LEFT breast Mentor MemoryShape Medium Height Moderate Profile 120 ml Ref [id number], SN [id number]  DESCRIPTION OF PROCEDURE:  The patient's operative site was marked with the patient in the preoperative area in standing position to mark breast meridians, anterior axillary lines, sternal notch and chest midline The patient was taken to the operating room. SCDs were placed and IV antibiotics were given. The patient's operative site was prepped and draped in a sterile fashion. A time out was performed and all information was confirmed to be correct.  I began on right breast with incision in inframammary fold scar. Capsule entered and expander removed. Capsulotomies performed laterally and superiorly to the dimensions of  anticipated MM+ anatomic implant. Limited anterior capsulectomy performed over superior and medial poles to aid with adherence of textured device. 255 cc MM+ sizer placed and incision tailor tack closed. I then addressed the left breast with incision in inframammary fold.  Incision carried through superficial fascia and lateral border of pectoralis identified. Submuscular dissection completed in limited fashion according to anticipated 120 ml MM anatomic implant dimensions. Dual plane dissection completed to level of nipple on left. Sizer placed and tailor tacked closed. Patient brought to sitting position. Patient assessed for symmetry.  Patient was returned to supine position and cavities irrigated with solution containing Ancef, bacitracin, and gentamicin. Hemostasis ensured. 15 Fr JP drain placed in each cavity and secured to skin with 2-0 nylon. Final implants placed and correct positioning ensured. Closure completed with 3-0 vicryl to close capsule on right and superficial fascia on left. 4-0 vicryl used in dermis and skin closure completed with 4-0 monocryl subcuticular. Tissue adhesive applied followed by dry dressing and breast binder.   The patient was allowed to wake from anesthesia, extubated and taken to the recovery room in satisfactory condition.   SPECIMENS: none  DRAINS: 15 Fr JP in right and left breast  Irene Limbo, MD [facility] Plastic & Reconstructive Surgery [phone]

## 2014-11-08 NOTE — Transfer of Care (Signed)
Immediate Anesthesia Transfer of Care Note  Patient: Tara Fuller  Procedure(s) Performed: Procedure(s): REMOVAL OF RIGHT TISSUE EXPANDER PLACEMENT OF IMPLANT   (Right) LEFT BREAST AUGMENTATION FOR SYMMETRY WITH IMPLANT (Left)  Patient Location: PACU  Anesthesia Type:General  Level of Consciousness: awake, sedated and patient cooperative  Airway & Oxygen Therapy: Patient Spontanous Breathing and Patient connected to face mask oxygen  Post-op Assessment: Report given to RN, Post -op Vital signs reviewed and stable and Patient moving all extremities  Post vital signs: Reviewed and stable  Last Vitals:  Filed Vitals:   11/08/14 0623  BP: 137/91  Pulse: 103  Temp: 36.7 C  Resp: 16    Complications: No apparent anesthesia complications

## 2014-11-08 NOTE — Interval H&P Note (Signed)
History and Physical Interval Note:  11/08/2014 7:05 AM  Tara Fuller  has presented today for surgery, with the diagnosis of ACQUIRED ABSENCE BREAST PERSONAL HX OF BREAST CANCER ASYMMETRY NATIVE AND RECONSTRUCTED BREAST   The various methods of treatment have been discussed with the patient and family. After consideration of risks, benefits and other options for treatment, the patient has consented to  Procedure(s): REMOVAL OF RIGHT TISSUE EXPANDER PLACEMENT OF IMPLANT   (Right) LEFT BREAST AUGMENTATION FOR SYMMETRY   (Left) as a surgical intervention .  The patient's history has been reviewed, patient examined, no change in status, stable for surgery.  I have reviewed the patient's chart and labs.  Questions were answered to the patient's satisfaction.     Shawanda Sievert

## 2014-11-08 NOTE — Discharge Instructions (Signed)
About my Jackson-Pratt Bulb Drain ° °What is a Jackson-Pratt bulb? °A Jackson-Pratt is a soft, round device used to collect drainage. It is connected to a long, thin drainage catheter, which is held in place by one or two small stiches near your surgical incision site. When the bulb is squeezed, it forms a vacuum, forcing the drainage to empty into the bulb. ° °Emptying the Jackson-Pratt bulb- °To empty the bulb: °1. Release the plug on the top of the bulb. °2. Pour the bulb's contents into a measuring container which your nurse will provide. °3. Record the time emptied and amount of drainage. Empty the drain(s) as often as your     doctor or nurse recommends. ° °Date                  Time                    Amount (Drain 1)                 Amount (Drain 2) ° °_____________________________________________________________________ ° °_____________________________________________________________________ ° °_____________________________________________________________________ ° °_____________________________________________________________________ ° °_____________________________________________________________________ ° °_____________________________________________________________________ ° °_____________________________________________________________________ ° °_____________________________________________________________________ ° °Squeezing the Jackson-Pratt Bulb- °To squeeze the bulb: °1. Make sure the plug at the top of the bulb is open. °2. Squeeze the bulb tightly in your fist. You will hear air squeezing from the bulb. °3. Replace the plug while the bulb is squeezed. °4. Use a safety pin to attach the bulb to your clothing. This will keep the catheter from     pulling at the bulb insertion site. ° °When to call your doctor- °Call your doctor if: °· Drain site becomes red, swollen or hot. °· You have a fever greater than 101 degrees F. °· There is oozing at the drain site. °· Drain falls out (apply a guaze  bandage over the drain hole and secure it with tape). °· Drainage increases daily not related to activity patterns. (You will usually have more drainage when you are active than when you are resting.) °· Drainage has a bad odor. ° ° °Post Anesthesia Home Care Instructions ° °Activity: °Get plenty of rest for the remainder of the day. A responsible adult should stay with you for 24 hours following the procedure.  °For the next 24 hours, DO NOT: °-Drive a car °-Operate machinery °-Drink alcoholic beverages °-Take any medication unless instructed by your physician °-Make any legal decisions or sign important papers. ° °Meals: °Start with liquid foods such as gelatin or soup. Progress to regular foods as tolerated. Avoid greasy, spicy, heavy foods. If nausea and/or vomiting occur, drink only clear liquids until the nausea and/or vomiting subsides. Call your physician if vomiting continues. ° °Special Instructions/Symptoms: °Your throat may feel dry or sore from the anesthesia or the breathing tube placed in your throat during surgery. If this causes discomfort, gargle with warm salt water. The discomfort should disappear within 24 hours. ° °If you had a scopolamine patch placed behind your ear for the management of post- operative nausea and/or vomiting: ° °1. The medication in the patch is effective for 72 hours, after which it should be removed.  Wrap patch in a tissue and discard in the trash. Wash hands thoroughly with soap and water. °2. You may remove the patch earlier than 72 hours if you experience unpleasant side effects which may include dry mouth, dizziness or visual disturbances. °3. Avoid touching the patch. Wash your hands with soap and water after contact with the   patch. °  ° ° °

## 2014-11-08 NOTE — Anesthesia Postprocedure Evaluation (Signed)
Anesthesia Post Note  Patient: Tara Fuller  Procedure(s) Performed: Procedure(s) (LRB): REMOVAL OF RIGHT TISSUE EXPANDER PLACEMENT OF IMPLANT   (Right) LEFT BREAST AUGMENTATION FOR SYMMETRY WITH IMPLANT (Left)  Anesthesia type: General  Patient location: PACU  Post pain: Pain level controlled  Post assessment: Post-op Vital signs reviewed  Last Vitals: BP 130/85 mmHg  Pulse 89  Temp(Src) 36.8 C (Oral)  Resp 16  Ht 5\' 3"  (1.6 m)  Wt 108 lb (48.988 kg)  BMI 19.14 kg/m2  SpO2 99%  LMP 10/17/2014 (Exact Date)  Post vital signs: Reviewed  Level of consciousness: sedated  Complications: No apparent anesthesia complications

## 2014-11-08 NOTE — Anesthesia Procedure Notes (Signed)
Procedure Name: Intubation Date/Time: 11/08/2014 7:35 AM Performed by: Baxter Flattery Pre-anesthesia Checklist: Patient identified, Emergency Drugs available, Suction available and Patient being monitored Patient Re-evaluated:Patient Re-evaluated prior to inductionOxygen Delivery Method: Circle System Utilized Preoxygenation: Pre-oxygenation with 100% oxygen Intubation Type: IV induction Ventilation: Mask ventilation without difficulty Laryngoscope Size: Miller and 2 Grade View: Grade I Tube type: Oral Tube size: 7.0 mm Number of attempts: 1 Airway Equipment and Method: Stylet and Oral airway Placement Confirmation: ETT inserted through vocal cords under direct vision,  positive ETCO2 and breath sounds checked- equal and bilateral Secured at: 21 cm Tube secured with: Tape Dental Injury: Teeth and Oropharynx as per pre-operative assessment

## 2014-11-11 ENCOUNTER — Encounter (HOSPITAL_BASED_OUTPATIENT_CLINIC_OR_DEPARTMENT_OTHER): Payer: Self-pay | Admitting: Plastic Surgery

## 2015-03-25 ENCOUNTER — Encounter: Payer: Self-pay | Admitting: Hematology and Oncology

## 2015-03-25 ENCOUNTER — Ambulatory Visit (HOSPITAL_BASED_OUTPATIENT_CLINIC_OR_DEPARTMENT_OTHER): Payer: No Typology Code available for payment source | Admitting: Hematology and Oncology

## 2015-03-25 ENCOUNTER — Telehealth: Payer: Self-pay | Admitting: Hematology and Oncology

## 2015-03-25 VITALS — BP 139/93 | HR 93 | Temp 98.4°F | Resp 18 | Wt 113.2 lb

## 2015-03-25 DIAGNOSIS — Z170 Estrogen receptor positive status [ER+]: Secondary | ICD-10-CM | POA: Diagnosis not present

## 2015-03-25 DIAGNOSIS — C50411 Malignant neoplasm of upper-outer quadrant of right female breast: Secondary | ICD-10-CM

## 2015-03-25 DIAGNOSIS — D0511 Intraductal carcinoma in situ of right breast: Secondary | ICD-10-CM

## 2015-03-25 NOTE — Progress Notes (Signed)
Patient Care Team: Karlene Einstein, MD as PCP - General (Family Medicine)  DIAGNOSIS: Breast cancer of upper-outer quadrant of right female breast [facility])   Staging form: Breast, AJCC 7th Edition     Clinical: Stage 0 (Tis (DCIS), N0, M0) - Unsigned     Pathologic: Stage 0 (Tis (DCIS), N0, cM0) - Unsigned   SUMMARY OF ONCOLOGIC HISTORY:   Breast cancer of upper-outer quadrant of right female breast ([place])   06/07/2014 Initial Diagnosis Right breast biopsy (LOQ): DCIS with calcifications, low to intermediate grade, ER 90%, PR 30%   06/18/2014 Miscellaneous Genetic testing negative. Genes tested include: ATM, BARD1, BRCA1/2, BRIP1, CDH1, CHEK2, EPCAM, FANCC, MLH1, MSH2, MSH6, NBN, PALB2, PMS2, PTEN, RAD51C, RAD51D, STK11, TP53, and XRCC2.    06/18/2014 Breast MRI Right breast: 3.8 cm post biopsy hematoma centered in the 6 o'clock position of the right breast, posteriorly   07/09/2014 Surgery Right mastectomy with SLNB [facility]): DCIS with calcifications intermediate grade 0.6 cm 0/5 lymph nodes negative   07/09/2014 Pathologic Stage pTis, pN0: Stage 0   08/2014 -  Anti-estrogen oral therapy Tamoxifen daily. Planned duration of treatment 5 years [place]).    09/11/2014 Survivorship Survivorship care plan mailed to pt.     CHIEF COMPLIANT:  Follow-up on tamoxifen therapy  INTERVAL HISTORY: Tara Fuller is a  40 year old with above-mentioned history of right breast cancer currently on adjuvant tamoxifen and appears to be tolerating it extremely well. She denies any hot flashes or myalgias. She completed breast reconstruction therapy. Denies any lumps or nodules.  REVIEW OF SYSTEMS:   Constitutional: Denies fevers, chills or abnormal weight loss Eyes: Denies blurriness of vision Ears, nose, mouth, throat, and face: Denies mucositis or sore throat Respiratory: Denies cough, dyspnea or wheezes Cardiovascular: Denies palpitation, chest discomfort Gastrointestinal:  Denies nausea, heartburn or change in  bowel habits Skin: Denies abnormal skin rashes Lymphatics: Denies new lymphadenopathy or easy bruising Neurological:Denies numbness, tingling or new weaknesses Behavioral/Psych: Mood is stable, no new changes  Extremities: No lower extremity edema Breast:  denies any pain or lumps or nodules in either breasts All other systems were reviewed with the patient and are negative.  I have reviewed the past medical history, past surgical history, social history and family history with the patient and they are unchanged from previous note.  ALLERGIES:  has No Known Allergies.  MEDICATIONS:  Current Outpatient Prescriptions  Medication Sig Dispense Refill  . diazepam (VALIUM) 5 MG tablet Take 0.5-1 tablets (2.5-5 mg total) by mouth every 8 (eight) hours as needed for anxiety or muscle spasms. 20 tablet 0  . sulfamethoxazole-trimethoprim (BACTRIM DS,SEPTRA DS) 800-160 MG tablet Take 1 tablet by mouth 2 (two) times daily. 14 tablet 0  . tamoxifen (NOLVADEX) 20 MG tablet Take 1 tablet (20 mg total) by mouth daily. 90 tablet 3  . traMADol (ULTRAM) 50 MG tablet Take 1 tablet (50 mg total) by mouth every 6 (six) hours as needed. 30 tablet 0   No current facility-administered medications for this visit.    PHYSICAL EXAMINATION: ECOG PERFORMANCE STATUS: 1 - Symptomatic but completely ambulatory  Filed Vitals:   03/25/15 1020  BP: 139/93  Pulse: 93  Temp: 98.4 F (36.9 C)  Resp: 18   Filed Weights   03/25/15 1020  Weight: 113 lb 3.2 oz (51.347 kg)    GENERAL:alert, no distress and comfortable SKIN: skin color, texture, turgor are normal, no rashes or significant lesions EYES: normal, Conjunctiva are pink and non-injected, sclera clear OROPHARYNX:no  exudate, no erythema and lips, buccal mucosa, and tongue normal  NECK: supple, thyroid normal size, non-tender, without nodularity LYMPH:  no palpable lymphadenopathy in the cervical, axillary or inguinal LUNGS: clear to auscultation and  percussion with normal breathing effort HEART: regular rate & rhythm and no murmurs and no lower extremity edema ABDOMEN:abdomen soft, non-tender and normal bowel sounds MUSCULOSKELETAL:no cyanosis of digits and no clubbing  NEURO: alert & oriented x 3 with fluent speech, no focal motor/sensory deficits EXTREMITIES: No lower extremity edema  LABORATORY DATA:  I have reviewed the data as listed   Chemistry      Component Value Date/Time   NA 139 07/05/2014 1520   K 4.3 07/05/2014 1520   CL 105 07/05/2014 1520   CO2 27 07/05/2014 1520   BUN 13 07/05/2014 1520   CREATININE 0.83 07/05/2014 1520      Component Value Date/Time   CALCIUM 9.2 07/05/2014 1520       Lab Results  Component Value Date   WBC 7.1 07/05/2014   HGB 14.6 11/08/2014   HCT 41.1 07/05/2014   MCV 88.2 07/05/2014   PLT 164 07/05/2014   NEUTROABS 4.6 07/05/2014   ASSESSMENT & PLAN:  Breast cancer of upper-outer quadrant of right female breast ([place]) Right breast DCIS low to intermediate grade ER 80% PR 30% positive Tis N0 M0 stage 0 diagnosed 06/07/2014. Breast MRI revealed 3.8 x 2.4 x 1.7 cm postbiopsy hematoma right breast centered at 6:00 position Right mastectomy 07/09/2014: DCIS with calcifications intermediate grade 0.6 cm 0/5 lymph nodes negative ([facility] ROR 12% and with tamoxifen 3%)  Tamoxifen toxicities: Denies any hot flashes or myalgias Tolerating it extremely well  Breast cancer surveillance: Mammogram is to be done in June 2017 Patient  completed breast reconstruction  November 2016.  Return to clinic in 9 months for follow-up   No orders of the defined types were placed in this encounter.   The patient has a good understanding of the overall plan. she agrees with it. she will call with any problems that may develop before the next visit here.   Rulon Eisenmenger, MD 03/25/2015

## 2015-03-25 NOTE — Assessment & Plan Note (Signed)
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 Breast MRI revealed 3.8 x 2.4 x 1.7 cm postbiopsy hematoma right breast centered at 6:00 position Right mastectomy 07/09/2014: DCIS with calcifications intermediate grade 0.6 cm 0/5 lymph nodes negative ([facility] ROR 12% and with tamoxifen 3%)  Tamoxifen toxicities: Denies any hot flashes or myalgias Tolerating it extremely well  Breast cancer surveillance: Mammogram is to be done in June 2017 Patient is getting breast reconstruction. Likely implant placement in November.  Return to clinic in 6 months for follow-up

## 2015-03-25 NOTE — Telephone Encounter (Signed)
appt made and avs printed °

## 2015-04-03 ENCOUNTER — Other Ambulatory Visit: Payer: Self-pay | Admitting: *Deleted

## 2015-04-03 ENCOUNTER — Telehealth: Payer: Self-pay | Admitting: Hematology and Oncology

## 2015-04-03 NOTE — Telephone Encounter (Signed)
Spoke with patient and confirmed appt change from Dec to March 2018

## 2015-05-19 ENCOUNTER — Other Ambulatory Visit: Payer: Self-pay | Admitting: General Surgery

## 2015-05-19 ENCOUNTER — Other Ambulatory Visit: Payer: Self-pay

## 2015-05-19 DIAGNOSIS — Z853 Personal history of malignant neoplasm of breast: Secondary | ICD-10-CM

## 2015-05-19 DIAGNOSIS — Z1231 Encounter for screening mammogram for malignant neoplasm of breast: Secondary | ICD-10-CM

## 2015-06-03 ENCOUNTER — Ambulatory Visit
Admission: RE | Admit: 2015-06-03 | Discharge: 2015-06-03 | Disposition: A | Discharge status: Home / Self Care | Payer: No Typology Code available for payment source | Source: Ambulatory Visit

## 2015-06-03 ENCOUNTER — Other Ambulatory Visit: Payer: Self-pay

## 2015-06-03 DIAGNOSIS — Z853 Personal history of malignant neoplasm of breast: Secondary | ICD-10-CM

## 2015-06-03 DIAGNOSIS — Z1231 Encounter for screening mammogram for malignant neoplasm of breast: Secondary | ICD-10-CM

## 2015-06-04 ENCOUNTER — Ambulatory Visit: Payer: No Typology Code available for payment source

## 2015-08-07 ENCOUNTER — Other Ambulatory Visit: Payer: Self-pay | Admitting: Hematology and Oncology

## 2015-08-07 DIAGNOSIS — C50911 Malignant neoplasm of unspecified site of right female breast: Secondary | ICD-10-CM

## 2015-12-23 ENCOUNTER — Ambulatory Visit: Payer: PRIVATE HEALTH INSURANCE | Admitting: Hematology and Oncology

## 2016-01-23 ENCOUNTER — Telehealth: Payer: Self-pay

## 2016-01-23 NOTE — Telephone Encounter (Signed)
Pt called stating she thinks she chipped a tooth. She is questioning if taking tamoxifen will prevent her from having anesthesia for any dental procedures. Told her this should not affect receiving anesthesia but she needs to let the dentist know she is on tamoxifen.

## 2016-03-14 ENCOUNTER — Telehealth: Payer: Self-pay

## 2016-03-14 NOTE — Telephone Encounter (Signed)
Spoke with patient and she is aware of her new appt   Tara Fuller

## 2016-03-25 ENCOUNTER — Ambulatory Visit: Payer: No Typology Code available for payment source | Admitting: Hematology and Oncology

## 2016-04-01 ENCOUNTER — Encounter: Payer: Self-pay | Admitting: Hematology and Oncology

## 2016-04-01 ENCOUNTER — Ambulatory Visit (HOSPITAL_BASED_OUTPATIENT_CLINIC_OR_DEPARTMENT_OTHER): Payer: No Typology Code available for payment source | Admitting: Hematology and Oncology

## 2016-04-01 DIAGNOSIS — D0511 Intraductal carcinoma in situ of right breast: Secondary | ICD-10-CM | POA: Diagnosis not present

## 2016-04-01 DIAGNOSIS — Z79810 Long term (current) use of selective estrogen receptor modulators (SERMs): Secondary | ICD-10-CM | POA: Diagnosis not present

## 2016-04-01 DIAGNOSIS — Z170 Estrogen receptor positive status [ER+]: Secondary | ICD-10-CM | POA: Diagnosis not present

## 2016-04-01 DIAGNOSIS — C50011 Malignant neoplasm of nipple and areola, right female breast: Secondary | ICD-10-CM

## 2016-04-01 DIAGNOSIS — C50411 Malignant neoplasm of upper-outer quadrant of right female breast: Secondary | ICD-10-CM

## 2016-04-01 MED ORDER — TAMOXIFEN CITRATE 20 MG PO TABS: 20.0000 mg | ORAL_TABLET | Freq: Every day | ORAL | 3 refills | Status: DC

## 2016-04-01 NOTE — Assessment & Plan Note (Signed)
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 Breast MRI revealed 3.8 x 2.4 x 1.7 cm postbiopsy hematoma right breast centered at 6:00 position Right mastectomy 07/09/2014: DCIS with calcifications intermediate grade 0.6 cm 0/5 lymph nodes negative ([facility] ROR 12% and with tamoxifen 3%)  Tamoxifen toxicities: Denies any hot flashes or myalgias Tolerating it extremely well  Breast cancer surveillance: Left Mammogram 06/03/2015: Benign, breast density categories Patient  completed breast reconstruction  November 2016.  Return to clinic in 1 year for follow-up

## 2016-04-01 NOTE — Progress Notes (Signed)
Patient Care Team: Tara Einstein, MD as PCP - General (Family Medicine)  DIAGNOSIS:  Encounter Diagnosis  Name Primary?  . Malignant neoplasm of upper-outer quadrant of right breast in female, estrogen receptor positive ([place])     SUMMARY OF ONCOLOGIC HISTORY:   Breast cancer of upper-outer quadrant of right female breast ([place])   06/07/2014 Initial Diagnosis    Right breast biopsy (LOQ): DCIS with calcifications, low to intermediate grade, ER 90%, PR 30%      06/18/2014 Miscellaneous    Genetic testing negative. Genes tested include: ATM, BARD1, BRCA1/2, BRIP1, CDH1, CHEK2, EPCAM, FANCC, MLH1, MSH2, MSH6, NBN, PALB2, PMS2, PTEN, RAD51C, RAD51D, STK11, TP53, and XRCC2.       06/18/2014 Breast MRI    Right breast: 3.8 cm post biopsy hematoma centered in the 6 o'clock position of the right breast, posteriorly      07/09/2014 Surgery    Right mastectomy with SLNB Donne Hazel): DCIS with calcifications intermediate grade 0.6 cm 0/5 lymph nodes negative      07/09/2014 Pathologic Stage    pTis, pN0: Stage 0      08/2014 -  Anti-estrogen oral therapy    Tamoxifen daily. Planned duration of treatment 5 years [place]).       09/11/2014 Survivorship    Survivorship care plan mailed to pt.       11/08/2014 Surgery    Breast reconstruction with implant     :  CHIEF COMPLIANT: Complaining of intermittent left breast and left axillary chest wall pain. She had 2 episodes of cloudiness in the head that lasted a few days.   INTERVAL HISTORY: Tara Fuller is a 41 year old with above-mentioned history of right breast cancer treated with right mastectomy and reconstruction. She is currently on tamoxifen therapy. She had 2 episodes of severe cloudiness in the head which had occurred during the time when she had a sinus infection and possibly she may have taken some Sudafed at that time. These symptoms have resolved and she is able to function normally. Lately she had 2 episodes of pain in the left  chest wall and left axilla.  REVIEW OF SYSTEMS:   Constitutional: Denies fevers, chills or abnormal weight loss Eyes: Denies blurriness of vision Ears, nose, mouth, throat, and face: Denies mucositis or sore throat Respiratory: Denies cough, dyspnea or wheezes Cardiovascular: Denies palpitation, chest discomfort Gastrointestinal:  Denies nausea, heartburn or change in bowel habits Skin: Denies abnormal skin rashes Lymphatics: Denies new lymphadenopathy or easy bruising Neurological:Denies numbness, tingling or new weaknesses Behavioral/Psych: Mood is stable, no new changes  Extremities: No lower extremity edema Breast: Left chest wall pain and discomfort All other systems were reviewed with the patient and are negative.  I have reviewed the past medical history, past surgical history, social history and family history with the patient and they are unchanged from previous note.  ALLERGIES:  has No Known Allergies.  MEDICATIONS:  Current Outpatient Prescriptions  Medication Sig Dispense Refill  . tamoxifen (NOLVADEX) 20 MG tablet TAKE 1 TABLET EVERY DAY 90 tablet 3   No current facility-administered medications for this visit.     PHYSICAL EXAMINATION: ECOG PERFORMANCE STATUS: 1 - Symptomatic but completely ambulatory  Vitals:   04/01/16 1021  BP: (!) 135/93  Pulse: (!) 108  Resp: 18  Temp: 98 F (36.7 C)   Filed Weights   04/01/16 1021  Weight: 110 lb 6.4 oz (50.1 kg)    GENERAL:alert, no distress and comfortable SKIN: skin color, texture, turgor  are normal, no rashes or significant lesions EYES: normal, Conjunctiva are pink and non-injected, sclera clear OROPHARYNX:no exudate, no erythema and lips, buccal mucosa, and tongue normal  NECK: supple, thyroid normal size, non-tender, without nodularity LYMPH:  no palpable lymphadenopathy in the cervical, axillary or inguinal LUNGS: clear to auscultation and percussion with normal breathing effort HEART: regular rate &  rhythm and no murmurs and no lower extremity edema ABDOMEN:abdomen soft, non-tender and normal bowel sounds MUSCULOSKELETAL:no cyanosis of digits and no clubbing  NEURO: alert & oriented x 3 with fluent speech, no focal motor/sensory deficits EXTREMITIES: No lower extremity edema BREAST: No palpable masses or nodules in either right or left breasts. No palpable axillary supraclavicular or infraclavicular adenopathy no breast tenderness or nipple discharge. (exam performed in the presence of a chaperone)  LABORATORY DATA:  I have reviewed the data as listed   Chemistry      Component Value Date/Time   NA 139 07/05/2014 1520   K 4.3 07/05/2014 1520   CL 105 07/05/2014 1520   CO2 27 07/05/2014 1520   BUN 13 07/05/2014 1520   CREATININE 0.83 07/05/2014 1520      Component Value Date/Time   CALCIUM 9.2 07/05/2014 1520       Lab Results  Component Value Date   WBC 7.1 07/05/2014   HGB 14.6 11/08/2014   HCT 41.1 07/05/2014   MCV 88.2 07/05/2014   PLT 164 07/05/2014   NEUTROABS 4.6 07/05/2014    ASSESSMENT & PLAN:  Breast cancer of upper-outer quadrant of right female breast (HCC) 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 Breast MRI revealed 3.8 x 2.4 x 1.7 cm postbiopsy hematoma right breast centered at 6:00 position Right mastectomy 07/09/2014: DCIS with calcifications intermediate grade 0.6 cm 0/5 lymph nodes negative ([facility] ROR 12% and with tamoxifen 3%)  Tamoxifen toxicities: Denies any hot flashes or myalgias Tolerating it extremely well  Cloudiness in the head: Probably related to pseudoephedrine from the cold medicine. These symptoms have resolved.  Breast cancer surveillance: Left Mammogram 06/03/2015: Benign, breast density categories Patient  completed breast reconstruction  November 2016.  Return to clinic in 1 year for follow-up   I spent 25 minutes talking to the patient of which more than  half was spent in counseling and coordination of care.  No orders of the defined types were placed in this encounter.  The patient has a good understanding of the overall plan. she agrees with it. she will call with any problems that may develop before the next visit here.   Rulon Eisenmenger, MD 04/01/16

## 2016-04-22 ENCOUNTER — Other Ambulatory Visit: Payer: Self-pay | Admitting: General Surgery

## 2016-04-22 DIAGNOSIS — Z1231 Encounter for screening mammogram for malignant neoplasm of breast: Secondary | ICD-10-CM

## 2016-04-22 DIAGNOSIS — Z9011 Acquired absence of right breast and nipple: Secondary | ICD-10-CM

## 2016-06-03 ENCOUNTER — Ambulatory Visit
Admission: RE | Admit: 2016-06-03 | Discharge: 2016-06-03 | Disposition: A | Discharge status: Home / Self Care | Payer: No Typology Code available for payment source | Source: Ambulatory Visit | Attending: General Surgery | Admitting: General Surgery

## 2016-06-03 DIAGNOSIS — Z9011 Acquired absence of right breast and nipple: Secondary | ICD-10-CM

## 2016-06-03 DIAGNOSIS — Z1231 Encounter for screening mammogram for malignant neoplasm of breast: Secondary | ICD-10-CM

## 2017-03-29 ENCOUNTER — Telehealth: Payer: Self-pay | Admitting: Hematology and Oncology

## 2017-03-29 ENCOUNTER — Inpatient Hospital Stay
Payer: No Typology Code available for payment source | Attending: Hematology and Oncology | Admitting: Hematology and Oncology

## 2017-03-29 DIAGNOSIS — D0511 Intraductal carcinoma in situ of right breast: Secondary | ICD-10-CM

## 2017-03-29 DIAGNOSIS — Z79810 Long term (current) use of selective estrogen receptor modulators (SERMs): Secondary | ICD-10-CM

## 2017-03-29 DIAGNOSIS — C50411 Malignant neoplasm of upper-outer quadrant of right female breast: Secondary | ICD-10-CM

## 2017-03-29 DIAGNOSIS — C50011 Malignant neoplasm of nipple and areola, right female breast: Secondary | ICD-10-CM

## 2017-03-29 DIAGNOSIS — Z170 Estrogen receptor positive status [ER+]: Secondary | ICD-10-CM | POA: Diagnosis not present

## 2017-03-29 MED ORDER — TAMOXIFEN CITRATE 20 MG PO TABS: 20.0000 mg | ORAL_TABLET | Freq: Every day | ORAL | 3 refills | Status: DC

## 2017-03-29 NOTE — Assessment & Plan Note (Signed)
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 Breast MRI revealed 3.8 x 2.4 x 1.7 cm postbiopsy hematoma right breast centered at 6:00 position Right mastectomy 07/09/2014: DCIS with calcifications intermediate grade 0.6 cm 0/5 lymph nodes negative ([facility] ROR 12% and with tamoxifen 3%)  Tamoxifen toxicities: Denies any hot flashes or myalgias Tolerating it extremely well   Breast cancer surveillance: Left Mammogram 06/03/2016: Benign, breast density category B Patient completed breast reconstruction November 2016.   Return to clinic in 1 year for follow-up

## 2017-03-29 NOTE — Telephone Encounter (Signed)
Gave patient AVs and calendar of upcoming march 2020 appointments.  °

## 2017-03-29 NOTE — Progress Notes (Signed)
Patient Care Team: Karlene Einstein, MD as PCP - General (Family Medicine)  DIAGNOSIS:  Encounter Diagnosis  Name Primary?  . Malignant neoplasm of upper-outer quadrant of right breast in female, estrogen receptor positive ([place])     SUMMARY OF ONCOLOGIC HISTORY:   Breast cancer of upper-outer quadrant of right female breast ([place])   06/07/2014 Initial Diagnosis    Right breast biopsy (LOQ): DCIS with calcifications, low to intermediate grade, ER 90%, PR 30%      06/18/2014 Miscellaneous    Genetic testing negative. Genes tested include: ATM, BARD1, BRCA1/2, BRIP1, CDH1, CHEK2, EPCAM, FANCC, MLH1, MSH2, MSH6, NBN, PALB2, PMS2, PTEN, RAD51C, RAD51D, STK11, TP53, and XRCC2.       06/18/2014 Breast MRI    Right breast: 3.8 cm post biopsy hematoma centered in the 6 o'clock position of the right breast, posteriorly      07/09/2014 Surgery    Right mastectomy with SLNB Donne Hazel): DCIS with calcifications intermediate grade 0.6 cm 0/5 lymph nodes negative      07/09/2014 Pathologic Stage    pTis, pN0: Stage 0      08/2014 -  Anti-estrogen oral therapy    Tamoxifen daily. Planned duration of treatment 5 years [place]).       09/11/2014 Survivorship    Survivorship care plan mailed to pt.       11/08/2014 Surgery    Breast reconstruction with implant       CHIEF COMPLIANT: Follow-up on tamoxifen therapy  INTERVAL HISTORY: Tara Fuller is a 42 year old with above-mentioned history of right breast DCIS status post lumpectomy with reconstruction is currently on tamoxifen.  She is tolerating tamoxifen fairly well.  She has occasional leg cramps.  She has some discomfort in the back related to breast reconstruction.  Denies any significant hot flashes.  She has had some vaginal dryness.  REVIEW OF SYSTEMS:   Constitutional: Denies fevers, chills or abnormal weight loss Eyes: Denies blurriness of vision Ears, nose, mouth, throat, and face: Denies mucositis or sore throat Respiratory:  Denies cough, dyspnea or wheezes Cardiovascular: Denies palpitation, chest discomfort Gastrointestinal:  Denies nausea, heartburn or change in bowel habits Skin: Denies abnormal skin rashes Lymphatics: Denies new lymphadenopathy or easy bruising Neurological:Denies numbness, tingling or new weaknesses Behavioral/Psych: Mood is stable, no new changes  Extremities: No lower extremity edema All other systems were reviewed with the patient and are negative.  I have reviewed the past medical history, past surgical history, social history and family history with the patient and they are unchanged from previous note.  ALLERGIES:  has No Known Allergies.  MEDICATIONS:  Current Outpatient Medications  Medication Sig Dispense Refill  . tamoxifen (NOLVADEX) 20 MG tablet Take 1 tablet (20 mg total) by mouth daily. 90 tablet 3   No current facility-administered medications for this visit.     PHYSICAL EXAMINATION: ECOG PERFORMANCE STATUS: 1 - Symptomatic but completely ambulatory  Vitals:   03/29/17 1038  BP: (!) 136/95  Pulse: 93  Resp: 18  Temp: 98 F (36.7 C)  SpO2: 100%   Filed Weights   03/29/17 1038  Weight: 113 lb 1.6 oz (51.3 kg)    GENERAL:alert, no distress and comfortable SKIN: skin color, texture, turgor are normal, no rashes or significant lesions EYES: normal, Conjunctiva are pink and non-injected, sclera clear OROPHARYNX:no exudate, no erythema and lips, buccal mucosa, and tongue normal  NECK: supple, thyroid normal size, non-tender, without nodularity LYMPH:  no palpable lymphadenopathy in the cervical, axillary or inguinal  LUNGS: clear to auscultation and percussion with normal breathing effort HEART: regular rate & rhythm and no murmurs and no lower extremity edema ABDOMEN:abdomen soft, non-tender and normal bowel sounds MUSCULOSKELETAL:no cyanosis of digits and no clubbing  NEURO: alert & oriented x 3 with fluent speech, no focal motor/sensory  deficits EXTREMITIES: No lower extremity edema BREAST: No palpable masses or nodules in either right or left breasts. No palpable axillary supraclavicular or infraclavicular adenopathy no breast tenderness or nipple discharge. (exam performed in the presence of a chaperone)  LABORATORY DATA:  I have reviewed the data as listed CMP Latest Ref Rng & Units 07/05/2014  Glucose 65 - 99 mg/dL 90  BUN 6 - 20 mg/dL 13  Creatinine 0.44 - 1.00 mg/dL 0.83  Sodium 135 - 145 mmol/L 139  Potassium 3.5 - 5.1 mmol/L 4.3  Chloride 101 - 111 mmol/L 105  CO2 22 - 32 mmol/L 27  Calcium 8.9 - 10.3 mg/dL 9.2    Lab Results  Component Value Date   WBC 7.1 07/05/2014   HGB 14.6 11/08/2014   HCT 41.1 07/05/2014   MCV 88.2 07/05/2014   PLT 164 07/05/2014   NEUTROABS 4.6 07/05/2014    ASSESSMENT & PLAN:  Breast cancer of upper-outer quadrant of right female breast (HCC) 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 Breast MRI revealed 3.8 x 2.4 x 1.7 cm postbiopsy hematoma right breast centered at 6:00 position Right mastectomy 07/09/2014: DCIS with calcifications intermediate grade 0.6 cm 0/5 lymph nodes negative ([facility] ROR 12% and with tamoxifen 3%)  Tamoxifen toxicities: Denies any hot flashes or myalgias Tolerating it extremely well Vaginal dryness: Patient was provided with information about over-the-counter products including coconut oil to treat vaginal dryness.  Breast cancer surveillance: Left Mammogram 06/03/2016: Benign, breast density category B Patient completed breast reconstruction November 2016.   Return to clinic in 1 year for follow-up  I spent 15 minutes talking to the patient of which more than half was spent in counseling and coordination of care.  No orders of the defined types were placed in this encounter.  The patient has a good understanding of the overall plan. she agrees with it. she will call with any  problems that may develop before the next visit here.   Harriette Ohara, MD 03/29/17

## 2017-04-28 ENCOUNTER — Other Ambulatory Visit: Payer: Self-pay | Admitting: General Surgery

## 2017-04-28 DIAGNOSIS — Z1231 Encounter for screening mammogram for malignant neoplasm of breast: Secondary | ICD-10-CM

## 2017-06-06 ENCOUNTER — Ambulatory Visit
Admission: RE | Admit: 2017-06-06 | Discharge: 2017-06-06 | Disposition: A | Discharge status: Home / Self Care | Payer: No Typology Code available for payment source | Source: Ambulatory Visit | Attending: General Surgery | Admitting: General Surgery

## 2017-06-06 DIAGNOSIS — Z1231 Encounter for screening mammogram for malignant neoplasm of breast: Secondary | ICD-10-CM

## 2017-10-04 ENCOUNTER — Telehealth: Payer: Self-pay

## 2017-10-04 ENCOUNTER — Telehealth: Payer: Self-pay | Admitting: *Deleted

## 2017-10-04 NOTE — Telephone Encounter (Signed)
"  Wannetta Sender calling to ask Dr,Gudena about Tamoxifen's effect on bladder and what he thinks about PCP referring me to Urologist."    Call transferred to collaborative.

## 2017-10-04 NOTE — Telephone Encounter (Signed)
Returned patient's call.  Patient had recent physical at PCP and had blood in urine, microscopically per patient.  PCP recommended referral to urologist.  Patient concerned if Tamoxifen could be related to hematuria. Patient denies pain or frequency with urination.  Nurse informed patient that vaginal dryness can be a side effect of Tamoxifen.  Patient has appointment with OBGYN this afternoon as well.  Nurse encouraged patient to keep OBGYN appointment and obtain their recommendations as well.  Patient voiced understanding and agreement.  Will follow up with office.  No further needs at this time.

## 2018-03-20 ENCOUNTER — Other Ambulatory Visit: Payer: Self-pay | Admitting: *Deleted

## 2018-03-20 ENCOUNTER — Telehealth: Payer: Self-pay | Admitting: *Deleted

## 2018-03-20 ENCOUNTER — Other Ambulatory Visit: Payer: Self-pay | Admitting: General Surgery

## 2018-03-20 DIAGNOSIS — C50011 Malignant neoplasm of nipple and areola, right female breast: Secondary | ICD-10-CM

## 2018-03-20 DIAGNOSIS — Z1231 Encounter for screening mammogram for malignant neoplasm of breast: Secondary | ICD-10-CM

## 2018-03-20 DIAGNOSIS — Z170 Estrogen receptor positive status [ER+]: Principal | ICD-10-CM

## 2018-03-20 MED ORDER — TAMOXIFEN CITRATE 20 MG PO TABS: 20.0000 mg | ORAL_TABLET | Freq: Every day | ORAL | 0 refills | Status: DC

## 2018-03-20 NOTE — Telephone Encounter (Signed)
Pt called wanting to reschedule apt coming up on 03/27/2018 and asking for refill for tamoxifen until she she can come in for her follow up apt.  Message sent to scheduling to reschedule apt and to call pt and let her be aware of date and time.  Also prescription sent to pt [facility] for Tamoxifen refill.

## 2018-03-27 ENCOUNTER — Ambulatory Visit: Payer: No Typology Code available for payment source | Admitting: Hematology and Oncology

## 2018-03-28 ENCOUNTER — Ambulatory Visit: Payer: No Typology Code available for payment source | Admitting: Hematology and Oncology

## 2018-05-16 NOTE — Assessment & Plan Note (Signed)
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 Breast MRI revealed 3.8 x 2.4 x 1.7 cm postbiopsy hematoma right breast centered at 6:00 position Right mastectomy 07/09/2014: DCIS with calcifications intermediate grade 0.6 cm 0/5 lymph nodes negative ([facility] ROR 12% and with tamoxifen 3%)  Tamoxifen toxicities: Denies any hot flashes or myalgias Tolerating it extremely well Vaginal dryness: Patient was provided with information about over-the-counter products including coconut oil to treat vaginal dryness.  Breast cancer surveillance:LeftMammogram6/03/2017: Benign, breast density category B Patient completed breast reconstruction November 2016.   Return to clinic in1 yearfor follow-up

## 2018-05-18 ENCOUNTER — Telehealth: Payer: Self-pay | Admitting: Hematology and Oncology

## 2018-05-18 NOTE — Telephone Encounter (Signed)
Changed 5/18 to doximity phone visit per sch msg.

## 2018-05-20 NOTE — Progress Notes (Signed)
HEMATOLOGY-ONCOLOGY  PROGRESS NOTE  CHIEF COMPLIANT: Follow-up on tamoxifen therapy  INTERVAL HISTORY: Tara Fuller is a 43 y.o.  with above-mentioned history of right breast DCIS who underwent a right mastectomy with reconstruction and is currently on tamoxifen. I last saw her over a year ago. Her most recent mammogram on 06/06/17 showed no evidence of malignancy. She presents today  for annual follow-up.     Breast cancer of upper-outer quadrant of right female breast ([place])   06/07/2014 Initial Diagnosis    Right breast biopsy (LOQ): DCIS with calcifications, low to intermediate grade, ER 90%, PR 30%    06/18/2014 Miscellaneous    Genetic testing negative. Genes tested include: ATM, BARD1, BRCA1/2, BRIP1, CDH1, CHEK2, EPCAM, FANCC, MLH1, MSH2, MSH6, NBN, PALB2, PMS2, PTEN, RAD51C, RAD51D, STK11, TP53, and XRCC2.     06/18/2014 Breast MRI    Right breast: 3.8 cm post biopsy hematoma centered in the 6 o'clock position of the right breast, posteriorly    07/09/2014 Surgery    Right mastectomy with SLNB Donne Hazel): DCIS with calcifications intermediate grade 0.6 cm 0/5 lymph nodes negative    07/09/2014 Pathologic Stage    pTis, pN0: Stage 0    08/2014 -  Anti-estrogen oral therapy    Tamoxifen daily. Planned duration of treatment 5 years [place]).     09/11/2014 Survivorship    Survivorship care plan mailed to pt.     11/08/2014 Surgery    Breast reconstruction with implant     REVIEW OF SYSTEMS:   Constitutional: Denies fevers, chills or abnormal weight loss Eyes: Denies blurriness of vision Ears, nose, mouth, throat, and face: Denies mucositis or sore throat Respiratory: Denies cough, dyspnea or wheezes Cardiovascular: Denies palpitation, chest discomfort Gastrointestinal:  Denies nausea, heartburn or change in bowel habits Skin: Denies abnormal skin rashes Lymphatics: Denies new lymphadenopathy or easy bruising Neurological:Denies numbness, tingling or new weaknesses  Behavioral/Psych: Mood is stable, no new changes  Extremities: No lower extremity edema Breast: denies any pain or lumps or nodules in either breasts All other systems were reviewed with the patient and are negative.  Observations/Objective:  There were no vitals filed for this visit. There is no height or weight on file to calculate BMI.  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 Breast MRI revealed 3.8 x 2.4 x 1.7 cm postbiopsy hematoma right breast centered at 6:00 position Right mastectomy 07/09/2014: DCIS with calcifications intermediate grade 0.6 cm 0/5 lymph nodes negative ([facility] ROR 12% and with tamoxifen 3%)  Tamoxifen toxicities: Denies any hot flashes or myalgias Tolerating it extremely well Vaginal dryness: Patient was provided with information about over-the-counter products including coconut oil to treat vaginal dryness.  Breast  cancer surveillance:LeftMammogram6/03/2017: Benign, breast density category B Patient completed breast reconstruction November 2016.   Return to clinic in1 yearfor follow-up   Rulon Eisenmenger, MD 05/22/2018   Julious Oka Dorshimer, am acting as scribe for Nicholas Lose, MD.  I have reviewed the above documentation for accuracy and completeness, and I agree with the above.

## 2018-05-22 ENCOUNTER — Other Ambulatory Visit: Payer: Self-pay

## 2018-05-22 ENCOUNTER — Inpatient Hospital Stay
Payer: No Typology Code available for payment source | Attending: Hematology and Oncology | Admitting: Hematology and Oncology

## 2018-05-22 DIAGNOSIS — C50011 Malignant neoplasm of nipple and areola, right female breast: Secondary | ICD-10-CM | POA: Diagnosis not present

## 2018-05-22 DIAGNOSIS — Z170 Estrogen receptor positive status [ER+]: Secondary | ICD-10-CM

## 2018-05-22 DIAGNOSIS — C50411 Malignant neoplasm of upper-outer quadrant of right female breast: Secondary | ICD-10-CM | POA: Diagnosis not present

## 2018-05-22 MED ORDER — TAMOXIFEN CITRATE 20 MG PO TABS: 20.0000 mg | ORAL_TABLET | Freq: Every day | ORAL | 3 refills | Status: DC

## 2018-06-12 ENCOUNTER — Ambulatory Visit: Payer: No Typology Code available for payment source

## 2018-07-24 ENCOUNTER — Other Ambulatory Visit: Payer: Self-pay

## 2018-07-24 ENCOUNTER — Ambulatory Visit
Admission: RE | Admit: 2018-07-24 | Discharge: 2018-07-24 | Disposition: A | Discharge status: Home / Self Care | Payer: No Typology Code available for payment source | Source: Ambulatory Visit | Attending: General Surgery | Admitting: General Surgery

## 2018-07-24 DIAGNOSIS — Z1231 Encounter for screening mammogram for malignant neoplasm of breast: Secondary | ICD-10-CM

## 2019-01-31 ENCOUNTER — Telehealth: Payer: Self-pay | Admitting: Hematology

## 2019-01-31 NOTE — Telephone Encounter (Signed)
Returned patient's phone call regarding rescheduling an appointment, left a voicemail. 

## 2019-02-15 ENCOUNTER — Telehealth: Payer: Self-pay | Admitting: Hematology and Oncology

## 2019-02-15 NOTE — Telephone Encounter (Signed)
Rescheduled per 2/10 sch msg, pt req. Called and left a msg. Mailing printout

## 2019-05-14 ENCOUNTER — Other Ambulatory Visit: Payer: Self-pay | Admitting: General Surgery

## 2019-05-14 DIAGNOSIS — Z1231 Encounter for screening mammogram for malignant neoplasm of breast: Secondary | ICD-10-CM

## 2019-05-20 NOTE — Progress Notes (Signed)
Patient Care Team: Karlene Einstein, MD as PCP - General (Family Medicine)  DIAGNOSIS:    ICD-10-CM   1. Ductal carcinoma in situ (DCIS) of right breast  D05.11     SUMMARY OF ONCOLOGIC HISTORY: Oncology History  Ductal carcinoma in situ (DCIS) of right breast  06/07/2014 Initial Diagnosis   Right breast biopsy (LOQ): DCIS with calcifications, low to intermediate grade, ER 90%, PR 30%   06/18/2014 Miscellaneous   Genetic testing negative. Genes tested include: ATM, BARD1, BRCA1/2, BRIP1, CDH1, CHEK2, EPCAM, FANCC, MLH1, MSH2, MSH6, NBN, PALB2, PMS2, PTEN, RAD51C, RAD51D, STK11, TP53, and XRCC2.    06/18/2014 Breast MRI   Right breast: 3.8 cm post biopsy hematoma centered in the 6 o'clock position of the right breast, posteriorly   07/09/2014 Surgery   Right mastectomy with SLNB Donne Hazel): DCIS with calcifications intermediate grade 0.6 cm 0/5 lymph nodes negative   07/09/2014 Pathologic Stage   pTis, pN0: Stage 0   08/2014 -  Anti-estrogen oral therapy   Tamoxifen daily. Planned duration of treatment 5 years [place]).    09/11/2014 Survivorship   Survivorship care plan mailed to pt.    11/08/2014 Surgery   Breast reconstruction with implant     CHIEF COMPLIANT: Follow-up of right breast DCIS on tamoxifen therapy  INTERVAL HISTORY: CECILLIA Fuller is a 44 y.o. with above-mentioned history of right breast DCIS who underwent a right mastectomy with reconstruction and is currently on tamoxifen. Mammogram on 07/24/18 showed no evidence of malignancy. She presents today for annual follow-up.  Denies any pain or discomfort in the reconstructed breast implant.  Tolerating tamoxifen extremely well.  ALLERGIES:  has No Known Allergies.  MEDICATIONS:  Current Outpatient Medications  Medication Sig Dispense Refill  . tamoxifen (NOLVADEX) 20 MG tablet Take 1 tablet (20 mg total) by mouth daily. 90 tablet 3   No current facility-administered medications for this visit.    PHYSICAL  EXAMINATION: ECOG PERFORMANCE STATUS: 1 - Symptomatic but completely ambulatory  Vitals:   05/21/19 0958  BP: 131/87  Pulse: 96  Resp: 17  Temp: (!) 97.3 F (36.3 C)  SpO2: 100%   Filed Weights   05/21/19 0958  Weight: 120 lb 1.6 oz (54.5 kg)    BREAST: No palpable masses or nodules in either right or left breasts. No palpable axillary supraclavicular or infraclavicular adenopathy no breast tenderness or nipple discharge. 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 Breast MRI revealed 3.8 x 2.4 x 1.7 cm postbiopsy hematoma right breast centered at 6:00 position Right mastectomy 07/09/2014: DCIS with calcifications intermediate grade 0.6 cm 0/5 lymph nodes negative ([facility] ROR 12%  and with tamoxifen 3%)  Tamoxifen toxicities: Denies any hot flashes or myalgias Tolerating it extremely well Vaginal dryness:stable  Breast cancer surveillance:LeftMammogram7/21/2020: Benign, breast density category B Patient completed breast reconstruction November 2016.  Breast exam 05/21/2019: Benign She will get the COVID-19 vaccine after the mammogram in July.  Patient completed 5 years of tamoxifen therapy by end of July.  She will discontinue it at that time. Return to clinic on an as-needed basis.   No  orders of the defined types were placed in this encounter.  The patient has a good understanding of the overall plan. she agrees with it. she will call with any problems that may develop before the next visit here.  Total time spent: 20 mins including face to face time and time spent for planning, charting and coordination of care  Gudena, Vinay, MD 05/21/2019  I, Molly Dorshimer, am acting as scribe for Dr. Vinay Gudena.  I have reviewed the above documentation for accuracy and completeness, and I agree with the above.         

## 2019-05-21 ENCOUNTER — Other Ambulatory Visit: Payer: Self-pay

## 2019-05-21 ENCOUNTER — Inpatient Hospital Stay
Payer: No Typology Code available for payment source | Attending: Hematology and Oncology | Admitting: Hematology and Oncology

## 2019-05-21 DIAGNOSIS — Z79810 Long term (current) use of selective estrogen receptor modulators (SERMs): Secondary | ICD-10-CM | POA: Diagnosis not present

## 2019-05-21 DIAGNOSIS — D0511 Intraductal carcinoma in situ of right breast: Secondary | ICD-10-CM | POA: Insufficient documentation

## 2019-05-21 DIAGNOSIS — Z9011 Acquired absence of right breast and nipple: Secondary | ICD-10-CM | POA: Diagnosis not present

## 2019-05-21 NOTE — Assessment & Plan Note (Signed)
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 Breast MRI revealed 3.8 x 2.4 x 1.7 cm postbiopsy hematoma right breast centered at 6:00 position Right mastectomy 07/09/2014: DCIS with calcifications intermediate grade 0.6 cm 0/5 lymph nodes negative ([facility] ROR 12% and with tamoxifen 3%)  Tamoxifen toxicities: Denies any hot flashes or myalgias Tolerating it extremely well Vaginal dryness: Patient was provided with information about over-the-counter products including coconut oil to treat vaginal dryness.  Breast cancer surveillance:LeftMammogram7/21/2020: Benign, breast density category B Patient completed breast reconstruction November 2016.  Breast exam 05/21/2019: Benign  Return to clinic in1 yearfor follow-up

## 2019-05-22 ENCOUNTER — Ambulatory Visit: Payer: No Typology Code available for payment source | Admitting: Hematology and Oncology

## 2019-07-25 ENCOUNTER — Other Ambulatory Visit: Payer: Self-pay | Admitting: Hematology and Oncology

## 2019-07-25 DIAGNOSIS — C50011 Malignant neoplasm of nipple and areola, right female breast: Secondary | ICD-10-CM

## 2019-07-30 ENCOUNTER — Ambulatory Visit
Admission: RE | Admit: 2019-07-30 | Discharge: 2019-07-30 | Disposition: A | Discharge status: Home / Self Care | Payer: No Typology Code available for payment source | Source: Ambulatory Visit | Attending: General Surgery | Admitting: General Surgery

## 2019-07-30 ENCOUNTER — Other Ambulatory Visit: Payer: Self-pay

## 2019-07-30 DIAGNOSIS — Z1231 Encounter for screening mammogram for malignant neoplasm of breast: Secondary | ICD-10-CM

## 2019-08-20 DIAGNOSIS — Z8616 Personal history of COVID-19: Secondary | ICD-10-CM

## 2019-08-20 HISTORY — DX: Personal history of COVID-19: Z86.16

## 2019-12-10 NOTE — H&P (Signed)
Subjective:     [name] is a 44 y.o. female.  Follow-up    5 years post implant exchange and opposite breast augmentation. Plan left breast mastopexy, excision skin lesions.  Also desires removal multiple nevi. Reports chin, left neck and right groin nevi frequently traumatized. Also notes nevus right back, irritated by bra strap. No prior biopsies. Denies history skin cancer.   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 Patient diagnosed with DCIS Right breast, ER/PR + following screening MMG showing change in calcifications. MRI with no masses or abnormal enhancement bilateral. Calcifications spanned 5 cm. Two aunts with breast ca,genetics negative.  Completed tamoxifen.  MMG left 07/2019 benign  Prior 32/34 A cup, current B cup . Mastectomy wt 150 g  Works in [employer].     Objective:   Physical Exam  Cardiovascular: Normal rate, regular rhythm and normal heart sounds.  Pulmonary/Chest: Effort normal and breath sounds normal.   HEENT: chin and left neck with papular skin lesions  Right groin pedunculated pink skin lesion per patient present since child  Chest:  Breasts soft Baker 1 bilateral, left volume greater  No masses bilateral Axilla benign bilateral SN to nipple R 18  L 21 cm Nipple to IMF R 7 L 8 cm Midline to nipple R 8 L 8 cm rippling medial breast right Minimal animation  Assessment:     DCIS R breast S/p NSM, dual plane TE/ADM (Alloderm) reconstruction. S/p removal/replacement TE for seroma S/p placement anatomic implants, left breast augmentation Intradermal nevi chin, left neck; benign nevus right groin and back    Plan:   Plan left mastopexy, possible removal left breast implant, excision skin lesions left chin, right groin.  Patient has skin lesions chin right groin present for years with frequent traumatization. Plan excision. Reviewed scar maturation over months, scar longer than lesion  itself.  Increased ptosis soft tissue over implant on left. Reviewed mastopexy with anchor type scar, no drains anticipated for this. With mastopexy alone, reviewed this will offer more symmetry with regards to NAC position but possible she will still have significant asymmetry volume breasts. Patient has had 14 lb weight increase since time of implant placement. She declines any procedure over right chest. We discussed in past changing left breast implant, but she is at lowest volume implant in this type presently and within round gel implants implants with only few ml difference lower available. Reviewed her pictures from prior to mastectomy and the change in left breast appearance over time. Reviewed in mirror the upper pole fullness obtained by implant vs the volume of her natural breast. I counseled her she may have more symmetry at this time with implant removal. We agree to proceed with mastopexy and if on table result with significant volume asymmetry will plan removal left breast implant. Risks of this include result may be smaller volume than right and loss of the upper pole fullness associated with implant based reconstruction.    Patient contracted COVID 08/2019. Discussed risk COVID infectionthrough this elective surgery. Patient will receive COVID testing prior to surgery. Discussed even if patient receivesa negative test result, the tests in some cases may fail to detect the virus or patient maycontract COVID after the test.COVID 19 infectionbefore/during/aftersurgery may result in lead to a higher chance of complication and death.  Rx for Tramadol will be given on day of surgery-notes last surgery had significant nausea and afraid to take any pain medication but desires Rx in  case needed.   RIGHT breast Mentor MemoryShape Medium Height Moderate Plus Profile 255 ml. LEFT breast Mentor MemoryShape Medium Height Moderate Profile 120 ml

## 2019-12-21 ENCOUNTER — Encounter (HOSPITAL_BASED_OUTPATIENT_CLINIC_OR_DEPARTMENT_OTHER): Payer: Self-pay | Admitting: Plastic Surgery

## 2019-12-21 ENCOUNTER — Other Ambulatory Visit: Payer: Self-pay

## 2019-12-25 NOTE — Progress Notes (Signed)
Called and spoke with the pt to discuss her plans for the holiday and ability to quarantine. Pt states that she has already made arrangements to stay at home.and verbalized understanding. Questions answered and instructions reviewed.

## 2019-12-26 HISTORY — PX: AUGMENTATION MAMMAPLASTY: SUR837

## 2019-12-27 ENCOUNTER — Other Ambulatory Visit (HOSPITAL_COMMUNITY)
Admission: RE | Admit: 2019-12-27 | Discharge: 2019-12-27 | Disposition: A | Discharge status: Home / Self Care | Payer: No Typology Code available for payment source | Source: Ambulatory Visit | Attending: Plastic Surgery | Admitting: Plastic Surgery

## 2019-12-27 DIAGNOSIS — Z01812 Encounter for preprocedural laboratory examination: Secondary | ICD-10-CM | POA: Diagnosis not present

## 2019-12-27 DIAGNOSIS — Z20822 Contact with and (suspected) exposure to covid-19: Secondary | ICD-10-CM | POA: Diagnosis not present

## 2019-12-27 LAB — SARS CORONAVIRUS 2 (TAT 6-24 HRS): SARS Coronavirus 2: NEGATIVE

## 2019-12-27 NOTE — Progress Notes (Signed)

## 2019-12-29 NOTE — Anesthesia Preprocedure Evaluation (Addendum)
Anesthesia Evaluation  Patient identified by MRN, date of birth, ID band Patient awake    Reviewed: Allergy & Precautions, NPO status , Patient's Chart, lab work & pertinent test results  History of Anesthesia Complications (+) PONV  Airway Mallampati: II  TM Distance: >3 FB Neck ROM: Full    Dental no notable dental hx. (+) Teeth Intact, Dental Advisory Given   Pulmonary neg pulmonary ROS,    Pulmonary exam normal breath sounds clear to auscultation       Cardiovascular negative cardio ROS Normal cardiovascular exam Rhythm:Regular Rate:Normal     Neuro/Psych negative neurological ROS     GI/Hepatic Neg liver ROS,   Endo/Other  negative endocrine ROS  Renal/GU negative Renal ROS     Musculoskeletal negative musculoskeletal ROS (+)   Abdominal   Peds  Hematology negative hematology ROS (+)   Anesthesia Other Findings   Reproductive/Obstetrics negative OB ROS                            Anesthesia Physical Anesthesia Plan  ASA: I  Anesthesia Plan: General   Post-op Pain Management:    Induction: Intravenous  PONV Risk Score and Plan: 4 or greater and Treatment may vary due to age or medical condition, Ondansetron, Dexamethasone and Scopolamine patch - Pre-op  Airway Management Planned: Oral ETT  Additional Equipment: None  Intra-op Plan:   Post-operative Plan: Extubation in OR  Informed Consent: I have reviewed the patients History and Physical, chart, labs and discussed the procedure including the risks, benefits and alternatives for the proposed anesthesia with the patient or authorized representative who has indicated his/her understanding and acceptance.     Dental advisory given  Plan Discussed with: CRNA and Anesthesiologist  Anesthesia Plan Comments:        Anesthesia Quick Evaluation

## 2019-12-31 ENCOUNTER — Ambulatory Visit (HOSPITAL_BASED_OUTPATIENT_CLINIC_OR_DEPARTMENT_OTHER)
Admission: RE | Admit: 2019-12-31 | Discharge: 2019-12-31 | Disposition: A | Discharge status: Home / Self Care | Payer: No Typology Code available for payment source | Source: Ambulatory Visit | Attending: Plastic Surgery | Admitting: Plastic Surgery

## 2019-12-31 ENCOUNTER — Encounter (HOSPITAL_BASED_OUTPATIENT_CLINIC_OR_DEPARTMENT_OTHER)
Admission: RE | Disposition: A | Discharge status: Home / Self Care | Payer: Self-pay | Source: Ambulatory Visit | Attending: Plastic Surgery

## 2019-12-31 ENCOUNTER — Ambulatory Visit (HOSPITAL_BASED_OUTPATIENT_CLINIC_OR_DEPARTMENT_OTHER): Payer: No Typology Code available for payment source | Admitting: Anesthesiology

## 2019-12-31 ENCOUNTER — Other Ambulatory Visit: Payer: Self-pay

## 2019-12-31 ENCOUNTER — Encounter (HOSPITAL_BASED_OUTPATIENT_CLINIC_OR_DEPARTMENT_OTHER): Payer: Self-pay | Admitting: Plastic Surgery

## 2019-12-31 DIAGNOSIS — Z9882 Breast implant status: Secondary | ICD-10-CM | POA: Diagnosis not present

## 2019-12-31 DIAGNOSIS — Z79899 Other long term (current) drug therapy: Secondary | ICD-10-CM | POA: Diagnosis not present

## 2019-12-31 DIAGNOSIS — N651 Disproportion of reconstructed breast: Secondary | ICD-10-CM | POA: Diagnosis not present

## 2019-12-31 DIAGNOSIS — Z86000 Personal history of in-situ neoplasm of breast: Secondary | ICD-10-CM | POA: Diagnosis not present

## 2019-12-31 DIAGNOSIS — D2239 Melanocytic nevi of other parts of face: Secondary | ICD-10-CM | POA: Insufficient documentation

## 2019-12-31 DIAGNOSIS — Z803 Family history of malignant neoplasm of breast: Secondary | ICD-10-CM | POA: Insufficient documentation

## 2019-12-31 DIAGNOSIS — D225 Melanocytic nevi of trunk: Secondary | ICD-10-CM | POA: Insufficient documentation

## 2019-12-31 DIAGNOSIS — Z9011 Acquired absence of right breast and nipple: Secondary | ICD-10-CM | POA: Diagnosis not present

## 2019-12-31 DIAGNOSIS — Z170 Estrogen receptor positive status [ER+]: Secondary | ICD-10-CM | POA: Insufficient documentation

## 2019-12-31 DIAGNOSIS — Z8616 Personal history of COVID-19: Secondary | ICD-10-CM | POA: Insufficient documentation

## 2019-12-31 HISTORY — PX: MASTOPEXY: SHX5358

## 2019-12-31 HISTORY — PX: MASS EXCISION: SHX2000

## 2019-12-31 HISTORY — PX: BREAST IMPLANT EXCHANGE: SHX6296

## 2019-12-31 HISTORY — PX: EXCISION MASS HEAD: SHX6702

## 2019-12-31 HISTORY — DX: Other specified postprocedural states: Z98.890

## 2019-12-31 HISTORY — DX: Other specified postprocedural states: R11.2

## 2019-12-31 LAB — POCT PREGNANCY, URINE: Preg Test, Ur: NEGATIVE

## 2019-12-31 SURGERY — MASTOPEXY
Anesthesia: General | Site: Groin | Laterality: Right

## 2019-12-31 MED ORDER — DEXAMETHASONE SODIUM PHOSPHATE 10 MG/ML IJ SOLN
INTRAMUSCULAR | Status: DC | PRN
  Administered 2019-12-31: 5 mg via INTRAVENOUS

## 2019-12-31 MED ORDER — BUPIVACAINE-EPINEPHRINE 0.25% -1:200000 IJ SOLN
INTRAMUSCULAR | Status: DC | PRN
  Administered 2019-12-31: 27 mL

## 2019-12-31 MED ORDER — LIDOCAINE HCL (CARDIAC) PF 100 MG/5ML IV SOSY
PREFILLED_SYRINGE | INTRAVENOUS | Status: DC | PRN
Start: 2019-12-31 — End: 2019-12-31
  Administered 2019-12-31: 60 mg via INTRATRACHEAL

## 2019-12-31 MED ORDER — TRAMADOL HCL 50 MG PO TABS: 50.0000 mg | ORAL_TABLET | Freq: Four times a day (QID) | ORAL | 0 refills | Status: AC | PRN

## 2019-12-31 MED ORDER — AMISULPRIDE (ANTIEMETIC) 5 MG/2ML IV SOLN: 10.0000 mg | Freq: Once | INTRAVENOUS | Status: DC | PRN

## 2019-12-31 MED ORDER — PROPOFOL 500 MG/50ML IV EMUL
INTRAVENOUS | Status: DC | PRN
  Administered 2019-12-31: 25 ug/kg/min via INTRAVENOUS

## 2019-12-31 MED ORDER — MIDAZOLAM HCL 2 MG/2ML IJ SOLN
INTRAMUSCULAR | Status: DC | PRN
  Administered 2019-12-31: 2 mg via INTRAVENOUS

## 2019-12-31 MED ORDER — CELECOXIB 200 MG PO CAPS
200.0000 mg | ORAL_CAPSULE | ORAL | Status: AC
  Administered 2019-12-31: 08:00:00 200 mg via ORAL

## 2019-12-31 MED ORDER — DEXMEDETOMIDINE (PRECEDEX) IN NS 20 MCG/5ML (4 MCG/ML) IV SYRINGE
PREFILLED_SYRINGE | INTRAVENOUS | Status: DC | PRN
  Administered 2019-12-31: 8 ug via INTRAVENOUS
  Administered 2019-12-31: 12 ug via INTRAVENOUS

## 2019-12-31 MED ORDER — 0.9 % SODIUM CHLORIDE (POUR BTL) OPTIME
TOPICAL | Status: DC | PRN
  Administered 2019-12-31: 10:00:00 300 mL

## 2019-12-31 MED ORDER — ONDANSETRON HCL 4 MG/2ML IJ SOLN
INTRAMUSCULAR | Status: AC
  Filled 2019-12-31: qty 2

## 2019-12-31 MED ORDER — CEFAZOLIN SODIUM-DEXTROSE 2-4 GM/100ML-% IV SOLN
2.0000 g | INTRAVENOUS | Status: AC
  Administered 2019-12-31: 09:00:00 2 g via INTRAVENOUS

## 2019-12-31 MED ORDER — CHLORHEXIDINE GLUCONATE CLOTH 2 % EX PADS: 6.0000 | MEDICATED_PAD | Freq: Once | CUTANEOUS | Status: DC

## 2019-12-31 MED ORDER — EPHEDRINE 5 MG/ML INJ
INTRAVENOUS | Status: AC
  Filled 2019-12-31: qty 10

## 2019-12-31 MED ORDER — SUGAMMADEX SODIUM 200 MG/2ML IV SOLN
INTRAVENOUS | Status: DC | PRN
  Administered 2019-12-31: 125 mg via INTRAVENOUS

## 2019-12-31 MED ORDER — DEXMEDETOMIDINE (PRECEDEX) IN NS 20 MCG/5ML (4 MCG/ML) IV SYRINGE
PREFILLED_SYRINGE | INTRAVENOUS | Status: AC
  Filled 2019-12-31: qty 5

## 2019-12-31 MED ORDER — GLYCOPYRROLATE 0.2 MG/ML IJ SOLN
INTRAMUSCULAR | Status: DC | PRN
  Administered 2019-12-31: .1 mg via INTRAVENOUS

## 2019-12-31 MED ORDER — ROCURONIUM BROMIDE 100 MG/10ML IV SOLN
INTRAVENOUS | Status: DC | PRN
  Administered 2019-12-31: 35 mg via INTRAVENOUS

## 2019-12-31 MED ORDER — MEPERIDINE HCL 25 MG/ML IJ SOLN: 6.2500 mg | INTRAMUSCULAR | Status: DC | PRN

## 2019-12-31 MED ORDER — CELECOXIB 200 MG PO CAPS
ORAL_CAPSULE | ORAL | Status: AC
  Filled 2019-12-31: qty 1

## 2019-12-31 MED ORDER — LACTATED RINGERS IV SOLN: INTRAVENOUS | Status: DC

## 2019-12-31 MED ORDER — ROCURONIUM BROMIDE 10 MG/ML (PF) SYRINGE
PREFILLED_SYRINGE | INTRAVENOUS | Status: AC
  Filled 2019-12-31: qty 10

## 2019-12-31 MED ORDER — GABAPENTIN 300 MG PO CAPS
300.0000 mg | ORAL_CAPSULE | ORAL | Status: AC
  Administered 2019-12-31: 08:00:00 300 mg via ORAL

## 2019-12-31 MED ORDER — ACETAMINOPHEN 500 MG PO TABS
1000.0000 mg | ORAL_TABLET | ORAL | Status: AC
  Administered 2019-12-31: 08:00:00 1000 mg via ORAL

## 2019-12-31 MED ORDER — ACETAMINOPHEN 500 MG PO TABS
ORAL_TABLET | ORAL | Status: AC
  Filled 2019-12-31: qty 2

## 2019-12-31 MED ORDER — SCOPOLAMINE 1 MG/3DAYS TD PT72
MEDICATED_PATCH | TRANSDERMAL | Status: AC
  Filled 2019-12-31: qty 1

## 2019-12-31 MED ORDER — CEFAZOLIN SODIUM-DEXTROSE 2-4 GM/100ML-% IV SOLN
INTRAVENOUS | Status: AC
  Filled 2019-12-31: qty 100

## 2019-12-31 MED ORDER — PROPOFOL 10 MG/ML IV BOLUS
INTRAVENOUS | Status: AC
  Filled 2019-12-31: qty 20

## 2019-12-31 MED ORDER — PROPOFOL 10 MG/ML IV BOLUS
INTRAVENOUS | Status: DC | PRN
  Administered 2019-12-31: 30 mg via INTRAVENOUS
  Administered 2019-12-31: 100 mg via INTRAVENOUS

## 2019-12-31 MED ORDER — PROPOFOL 500 MG/50ML IV EMUL
INTRAVENOUS | Status: AC
  Filled 2019-12-31: qty 50

## 2019-12-31 MED ORDER — HYDROCODONE-ACETAMINOPHEN 7.5-325 MG PO TABS: 1.0000 | ORAL_TABLET | Freq: Once | ORAL | Status: DC | PRN

## 2019-12-31 MED ORDER — HYDROMORPHONE HCL 1 MG/ML IJ SOLN
0.2500 mg | INTRAMUSCULAR | Status: DC | PRN
  Administered 2019-12-31: 0.25 mg via INTRAVENOUS

## 2019-12-31 MED ORDER — FENTANYL CITRATE (PF) 100 MCG/2ML IJ SOLN
INTRAMUSCULAR | Status: AC
  Filled 2019-12-31: qty 4

## 2019-12-31 MED ORDER — ARTIFICIAL TEARS OPHTHALMIC OINT
TOPICAL_OINTMENT | OPHTHALMIC | Status: DC | PRN
  Administered 2019-12-31: 1 via OPHTHALMIC

## 2019-12-31 MED ORDER — DEXAMETHASONE SODIUM PHOSPHATE 10 MG/ML IJ SOLN
INTRAMUSCULAR | Status: AC
  Filled 2019-12-31: qty 1

## 2019-12-31 MED ORDER — GABAPENTIN 300 MG PO CAPS
ORAL_CAPSULE | ORAL | Status: AC
  Filled 2019-12-31: qty 1

## 2019-12-31 MED ORDER — EPHEDRINE SULFATE 50 MG/ML IJ SOLN
INTRAMUSCULAR | Status: DC | PRN
  Administered 2019-12-31 (×2): 10 mg via INTRAVENOUS

## 2019-12-31 MED ORDER — ARTIFICIAL TEARS OPHTHALMIC OINT
TOPICAL_OINTMENT | OPHTHALMIC | Status: AC
  Filled 2019-12-31: qty 3.5

## 2019-12-31 MED ORDER — ACETAMINOPHEN 10 MG/ML IV SOLN: 1000.0000 mg | Freq: Once | INTRAVENOUS | Status: DC | PRN

## 2019-12-31 MED ORDER — MIDAZOLAM HCL 2 MG/2ML IJ SOLN
INTRAMUSCULAR | Status: AC
  Filled 2019-12-31: qty 2

## 2019-12-31 MED ORDER — BACITRACIN ZINC 500 UNIT/GM EX OINT
TOPICAL_OINTMENT | CUTANEOUS | Status: AC
  Filled 2019-12-31: qty 0.9

## 2019-12-31 MED ORDER — FENTANYL CITRATE (PF) 100 MCG/2ML IJ SOLN
INTRAMUSCULAR | Status: DC | PRN
  Administered 2019-12-31 (×4): 50 ug via INTRAVENOUS

## 2019-12-31 MED ORDER — SCOPOLAMINE 1 MG/3DAYS TD PT72
1.0000 | MEDICATED_PATCH | TRANSDERMAL | Status: DC
  Administered 2019-12-31: 08:00:00 1.5 mg via TRANSDERMAL

## 2019-12-31 MED ORDER — LIDOCAINE-EPINEPHRINE (PF) 1 %-1:200000 IJ SOLN
INTRAMUSCULAR | Status: AC
  Filled 2019-12-31: qty 30

## 2019-12-31 MED ORDER — ONDANSETRON HCL 4 MG/2ML IJ SOLN
INTRAMUSCULAR | Status: DC | PRN
  Administered 2019-12-31: 4 mg via INTRAVENOUS

## 2019-12-31 MED ORDER — HYDROMORPHONE HCL 1 MG/ML IJ SOLN
INTRAMUSCULAR | Status: AC
  Filled 2019-12-31: qty 0.5

## 2019-12-31 MED ORDER — LIDOCAINE 2% (20 MG/ML) 5 ML SYRINGE
INTRAMUSCULAR | Status: AC
  Filled 2019-12-31: qty 5

## 2019-12-31 MED ORDER — ONDANSETRON HCL 4 MG/2ML IJ SOLN: 4.0000 mg | Freq: Once | INTRAMUSCULAR | Status: DC | PRN

## 2019-12-31 MED ORDER — GLYCOPYRROLATE PF 0.2 MG/ML IJ SOSY
PREFILLED_SYRINGE | INTRAMUSCULAR | Status: AC
  Filled 2019-12-31: qty 1

## 2019-12-31 MED ORDER — BACITRACIN 500 UNIT/GM EX OINT
TOPICAL_OINTMENT | CUTANEOUS | Status: DC | PRN
  Administered 2019-12-31: 1 via TOPICAL

## 2019-12-31 SURGICAL SUPPLY — 104 items
BAG DECANTER FOR FLEXI CONT (MISCELLANEOUS) IMPLANT
BAND RUBBER #18 3X1/16 STRL (MISCELLANEOUS) IMPLANT
BENZOIN TINCTURE PRP APPL 2/3 (GAUZE/BANDAGES/DRESSINGS) IMPLANT
BINDER BREAST LRG (GAUZE/BANDAGES/DRESSINGS) IMPLANT
BINDER BREAST MEDIUM (GAUZE/BANDAGES/DRESSINGS) IMPLANT
BINDER BREAST XLRG (GAUZE/BANDAGES/DRESSINGS) IMPLANT
BINDER BREAST XXLRG (GAUZE/BANDAGES/DRESSINGS) IMPLANT
BLADE CLIPPER SURG (BLADE) IMPLANT
BLADE SURG 10 STRL SS (BLADE) ×10 IMPLANT
BLADE SURG 11 STRL SS (BLADE) IMPLANT
BLADE SURG 15 STRL LF DISP TIS (BLADE) ×3 IMPLANT
BLADE SURG 15 STRL SS (BLADE) ×2
BNDG GAUZE ELAST 4 BULKY (GAUZE/BANDAGES/DRESSINGS) ×10 IMPLANT
CANISTER SUCT 1200ML W/VALVE (MISCELLANEOUS) ×5 IMPLANT
CHLORAPREP W/TINT 26 (MISCELLANEOUS) ×10 IMPLANT
CLOSURE WOUND 1/2 X4 (GAUZE/BANDAGES/DRESSINGS)
COVER BACK TABLE 60X90IN (DRAPES) ×5 IMPLANT
COVER MAYO STAND STRL (DRAPES) ×5 IMPLANT
COVER WAND RF STERILE (DRAPES) IMPLANT
DECANTER SPIKE VIAL GLASS SM (MISCELLANEOUS) IMPLANT
DERMABOND ADVANCED (GAUZE/BANDAGES/DRESSINGS) ×4
DERMABOND ADVANCED .7 DNX12 (GAUZE/BANDAGES/DRESSINGS) ×6 IMPLANT
DRAIN CHANNEL 15F RND FF W/TCR (WOUND CARE) IMPLANT
DRAIN CHANNEL 19F RND (DRAIN) IMPLANT
DRAIN JP 10F RND SILICONE (MISCELLANEOUS) IMPLANT
DRAPE INCISE IOBAN 66X45 STRL (DRAPES) IMPLANT
DRAPE LAPAROTOMY 100X72 PEDS (DRAPES) IMPLANT
DRAPE TOP ARMCOVERS (MISCELLANEOUS) ×5 IMPLANT
DRAPE U-SHAPE 76X120 STRL (DRAPES) ×5 IMPLANT
DRAPE UTILITY XL STRL (DRAPES) ×5 IMPLANT
DRSG PAD ABDOMINAL 8X10 ST (GAUZE/BANDAGES/DRESSINGS) ×5 IMPLANT
DRSG TEGADERM 2-3/8X2-3/4 SM (GAUZE/BANDAGES/DRESSINGS) IMPLANT
DRSG TELFA 3X8 NADH (GAUZE/BANDAGES/DRESSINGS) IMPLANT
ELECT BLADE 4.0 EZ CLEAN MEGAD (MISCELLANEOUS)
ELECT COATED BLADE 2.86 ST (ELECTRODE) ×5 IMPLANT
ELECT NEEDLE BLADE 2-5/6 (NEEDLE) IMPLANT
ELECT REM PT RETURN 9FT ADLT (ELECTROSURGICAL) ×5
ELECT REM PT RETURN 9FT PED (ELECTROSURGICAL)
ELECTRODE BLDE 4.0 EZ CLN MEGD (MISCELLANEOUS) IMPLANT
ELECTRODE REM PT RETRN 9FT PED (ELECTROSURGICAL) IMPLANT
ELECTRODE REM PT RTRN 9FT ADLT (ELECTROSURGICAL) ×3 IMPLANT
EVACUATOR SILICONE 100CC (DRAIN) IMPLANT
GAUZE SPONGE 4X4 12PLY STRL (GAUZE/BANDAGES/DRESSINGS) IMPLANT
GAUZE SPONGE 4X4 12PLY STRL LF (GAUZE/BANDAGES/DRESSINGS) IMPLANT
GAUZE XEROFORM 1X8 LF (GAUZE/BANDAGES/DRESSINGS) IMPLANT
GLOVE ECLIPSE 6.5 STRL STRAW (GLOVE) ×5 IMPLANT
GLOVE SURG ENC MOIS LTX SZ6 (GLOVE) ×10 IMPLANT
GLOVE SURG ENC MOIS LTX SZ6.5 (GLOVE) IMPLANT
GLOVE SURG UNDER POLY LF SZ6.5 (GLOVE) IMPLANT
GLOVE SURG UNDER POLY LF SZ7 (GLOVE) ×10 IMPLANT
GOWN STRL REUS W/ TWL LRG LVL3 (GOWN DISPOSABLE) ×6 IMPLANT
GOWN STRL REUS W/TWL LRG LVL3 (GOWN DISPOSABLE) ×4
IV NS 500ML (IV SOLUTION)
IV NS 500ML BAXH (IV SOLUTION) IMPLANT
KIT FILL SYSTEM UNIVERSAL (SET/KITS/TRAYS/PACK) IMPLANT
MARKER SKIN DUAL TIP RULER LAB (MISCELLANEOUS) IMPLANT
NEEDLE FILTER BLUNT 18X 1/2SAF (NEEDLE)
NEEDLE FILTER BLUNT 18X1 1/2 (NEEDLE) IMPLANT
NEEDLE HYPO 25X1 1.5 SAFETY (NEEDLE) ×5 IMPLANT
NEEDLE HYPO 30GX1 BEV (NEEDLE) IMPLANT
NEEDLE PRECISIONGLIDE 27X1.5 (NEEDLE) ×5 IMPLANT
NS IRRIG 1000ML POUR BTL (IV SOLUTION) ×5 IMPLANT
PACK BASIN DAY SURGERY FS (CUSTOM PROCEDURE TRAY) ×5 IMPLANT
PENCIL SMOKE EVACUATOR (MISCELLANEOUS) ×5 IMPLANT
PIN SAFETY STERILE (MISCELLANEOUS) IMPLANT
SHEET MEDIUM DRAPE 40X70 STRL (DRAPES) ×10 IMPLANT
SLEEVE SCD COMPRESS KNEE MED (MISCELLANEOUS) ×5 IMPLANT
SPONGE GAUZE 2X2 8PLY STER LF (GAUZE/BANDAGES/DRESSINGS)
SPONGE GAUZE 2X2 8PLY STRL LF (GAUZE/BANDAGES/DRESSINGS) IMPLANT
SPONGE LAP 18X18 RF (DISPOSABLE) ×10 IMPLANT
STAPLER VISISTAT 35W (STAPLE) ×5 IMPLANT
STRIP CLOSURE SKIN 1/2X4 (GAUZE/BANDAGES/DRESSINGS) IMPLANT
SUCTION FRAZIER HANDLE 10FR (MISCELLANEOUS)
SUCTION TUBE FRAZIER 10FR DISP (MISCELLANEOUS) IMPLANT
SUT ETHILON 2 0 FS 18 (SUTURE) IMPLANT
SUT ETHILON 4 0 PS 2 18 (SUTURE) IMPLANT
SUT MNCRL AB 4-0 PS2 18 (SUTURE) ×15 IMPLANT
SUT MON AB 5-0 P3 18 (SUTURE) ×5 IMPLANT
SUT PDS 3-0 CT2 (SUTURE)
SUT PDS AB 2-0 CT2 27 (SUTURE) IMPLANT
SUT PDS II 3-0 CT2 27 ABS (SUTURE) IMPLANT
SUT PLAIN 5 0 P 3 18 (SUTURE) IMPLANT
SUT PROLENE 5 0 P 3 (SUTURE) IMPLANT
SUT PROLENE 6 0 P 1 18 (SUTURE) ×5 IMPLANT
SUT VIC AB 3-0 PS1 18 (SUTURE) ×4
SUT VIC AB 3-0 PS1 18XBRD (SUTURE) ×6 IMPLANT
SUT VIC AB 3-0 SH 27 (SUTURE)
SUT VIC AB 3-0 SH 27X BRD (SUTURE) IMPLANT
SUT VICRYL 4-0 PS2 18IN ABS (SUTURE) ×5 IMPLANT
SUT VLOC 180 0 24IN GS25 (SUTURE) ×5 IMPLANT
SWAB COLLECTION DEVICE MRSA (MISCELLANEOUS) IMPLANT
SWAB CULTURE ESWAB REG 1ML (MISCELLANEOUS) IMPLANT
SYR 20ML LL LF (SYRINGE) IMPLANT
SYR 50ML LL SCALE MARK (SYRINGE) IMPLANT
SYR BULB EAR ULCER 3OZ GRN STR (SYRINGE) IMPLANT
SYR BULB IRRIG 60ML STRL (SYRINGE) ×5 IMPLANT
SYR CONTROL 10ML LL (SYRINGE) ×5 IMPLANT
TAPE MEASURE VINYL STERILE (MISCELLANEOUS) IMPLANT
TOWEL GREEN STERILE FF (TOWEL DISPOSABLE) ×10 IMPLANT
TRAY DSU PREP LF (CUSTOM PROCEDURE TRAY) IMPLANT
TUBE CONNECTING 20'X1/4 (TUBING) ×1
TUBE CONNECTING 20X1/4 (TUBING) ×4 IMPLANT
UNDERPAD 30X36 HEAVY ABSORB (UNDERPADS AND DIAPERS) ×10 IMPLANT
YANKAUER SUCT BULB TIP NO VENT (SUCTIONS) ×5 IMPLANT

## 2019-12-31 NOTE — Anesthesia Postprocedure Evaluation (Signed)
Anesthesia Post Note  Patient: Tara Fuller  Procedure(s) Performed: MASTOPEXY (Left Breast) SILICONE IMPLANT REMOVAL (Left Breast) EXCISION BENIGN LESION CHIN <1CM, LAYERED CLOSURE CHIN 1CM (Left Chin) EXCISION BENIGN LESION RIGHT GROIN <1CM, LAYERED CLOSURE TRUNK 1CM (Right Groin)     Anesthesia Type: General Anesthetic complications: no   No complications documented.  Last Vitals:  Vitals:   12/31/19 1200 12/31/19 1233  BP: 126/89 (!) 132/93  Pulse: 89 88  Resp: 16 14  Temp:  (!) 36.4 C  SpO2: 100% 99%    Last Pain:  Vitals:   12/31/19 1233  TempSrc:   PainSc: 2                  Trevor Iha

## 2019-12-31 NOTE — Discharge Instructions (Signed)
Next dose of Tylenol can be given after 2:00PM ° ° ° ° °Post Anesthesia Home Care Instructions ° °Activity: °Get plenty of rest for the remainder of the day. A responsible individual must stay with you for 24 hours following the procedure.  °For the next 24 hours, DO NOT: °-Drive a car °-Operate machinery °-Drink alcoholic beverages °-Take any medication unless instructed by your physician °-Make any legal decisions or sign important papers. ° °Meals: °Start with liquid foods such as gelatin or soup. Progress to regular foods as tolerated. Avoid greasy, spicy, heavy foods. If nausea and/or vomiting occur, drink only clear liquids until the nausea and/or vomiting subsides. Call your physician if vomiting continues. ° °Special Instructions/Symptoms: °Your throat may feel dry or sore from the anesthesia or the breathing tube placed in your throat during surgery. If this causes discomfort, gargle with warm salt water. The discomfort should disappear within 24 hours. ° °If you had a scopolamine patch placed behind your ear for the management of post- operative nausea and/or vomiting: ° °1. The medication in the patch is effective for 72 hours, after which it should be removed.  Wrap patch in a tissue and discard in the trash. Wash hands thoroughly with soap and water. °2. You may remove the patch earlier than 72 hours if you experience unpleasant side effects which may include dry mouth, dizziness or visual disturbances. °3. Avoid touching the patch. Wash your hands with soap and water after contact with the patch. °   ° °

## 2019-12-31 NOTE — Op Note (Signed)
Operative Note   DATE OF OPERATION: 12.27.2021  LOCATION: [facility]-outpatient  SURGICAL DIVISION: Plastic Surgery  PREOPERATIVE DIAGNOSES:  1. History right breast cancer 2. Acquired absence breast 3. Asymmetry breast and reconstructed breast 4. Intradermal nevus chin 5. Nevus right groin  POSTOPERATIVE DIAGNOSES:  same  PROCEDURE:  1. Removal left breast implant 2. Left breast mastopexy 3. Excision benign lesion chin 0.8 cm 4. Layered closure chin 1 cm 5. Excision benign lesion right groin 1 cm 6. Layered closure trunk 1.5 cm  SURGEON: Irene Limbo MD MBA  ASSISTANT: none  ANESTHESIA:  General.   EBL: 75 ml  COMPLICATIONS: None immediate.   INDICATIONS FOR PROCEDURE:  The patient, Tara Fuller, is a 44 y.o. female born on December 03, 1975, is here for left breast mastopexy with possible implant removal for treatment asymmetry breasts following right nipple sparing mastectomy with implant based reconstruction and left breast augmentation. Patient also desires excision benign appearing nevi chin and groin that are subject to frequent trauma.   FINDINGS: Removed intact textured left breast anatomic implant from left chest. Left mastopexy 10.8 g  DESCRIPTION OF PROCEDURE:  The patient's operative sites were marked with the patient in the preoperative area. Over left breast, location desired placement left nipple marked symmetric with right reconstruction. Vertical limbs for mastopexy (8 cm from nipple to inframamammry fold) marked by displacement breast against meridian. The patient was taken to the operating room. SCDs were placed and IV antibiotics were given. The patient's operative site was prepped and draped in a sterile fashion. A time out was performed and all information was confirmed to be correct. Local anesthetic infiltrated over left chin lesion. Sharp excision lesion with margins completed diameter 0.8 cm. Layered closure completed with 5-0 monocryl in dermis, running  6-0 prolene for skin closure length 1 cm. Over right groin, local anesthetic infiltrated. Sharp excision lesion with margins completed diameter 1.0 cm. Layered closure completed with 4-0 monocryl in dermis, 4-0 monocryl subcuticular for skin closure length 1.5 cm  Patient's chest then prepped and draped. Over left breast, nipple areola marked with 42 mm diameter marker and incised. Superior pedicle maintained and remainder area marked for mastopexy deepithelialized. Medial and lateral flaps elevated. Patient tailor tacked closed and brought to upright position. Patient demonstrated asymmetry volume with left breast larger. Patient returned to supine position. Implant capsule entered at caudal border pectoralis muscle. Intact implant removed. Capsule noted to thin and no fluid noted in pocket. Pectoralis muscle sutured to chest wall with 0 V lock suture. Breast tailor tacked closed and brought to upright position. Location for nipple areola complex marked symmetric with right with 38 mm diameter marker. Patient returned to supine position. Remainder superior pedicle de epithelialized. Cavity irrigated and hemostasis obtained. Local anesthetic infiltrated. Closure completed with interrupted 3-0 vicryl in dermis along inframammary fold and vertical limb. NAC inset with interrupted 4-0 vicryl in dermis. Skin closure completed throughout with 4-0 monocryl subcuticular. Dermabond applied to left breast and right groin incisions. Antibiotic ointment applied to left chin incision. Dry dressing and breast binder applied to chest.  The patient was allowed to wake from anesthesia, extubated and taken to the recovery room in satisfactory condition.   SPECIMENS: left mastopexy  DRAINS: none

## 2019-12-31 NOTE — Transfer of Care (Signed)
Immediate Anesthesia Transfer of Care Note  Patient: Tara Fuller  Procedure(s) Performed: MASTOPEXY (Left Breast) SILICONE IMPLANT REMOVAL (Left Breast) EXCISION BENIGN LESION CHIN <1CM, LAYERED CLOSURE CHIN 1CM (Left Chin) EXCISION BENIGN LESION RIGHT GROIN <1CM, LAYERED CLOSURE TRUNK 1CM (Right Groin)  Patient Location: PACU  Anesthesia Type:General  Level of Consciousness: sedated  Airway & Oxygen Therapy: Patient Spontanous Breathing and Patient connected to face mask oxygen  Post-op Assessment: Report given to RN and Post -op Vital signs reviewed and stable  Post vital signs: Reviewed and stable  Last Vitals:  Vitals Value Taken Time  BP 116/76 12/31/19 1047  Temp    Pulse 102 12/31/19 1050  Resp 20 12/31/19 1050  SpO2 100 % 12/31/19 1050  Vitals shown include unvalidated device data.  Last Pain:  Vitals:   12/31/19 0753  TempSrc: Oral  PainSc: 0-No pain         Complications: No complications documented.

## 2019-12-31 NOTE — Anesthesia Procedure Notes (Signed)
Procedure Name: Intubation Date/Time: 12/31/2019 8:45 AM Performed by: Collier Bullock, CRNA Pre-anesthesia Checklist: Patient identified, Emergency Drugs available, Suction available and Patient being monitored Patient Re-evaluated:Patient Re-evaluated prior to induction Oxygen Delivery Method: Circle system utilized Preoxygenation: Pre-oxygenation with 100% oxygen Induction Type: IV induction Ventilation: Mask ventilation without difficulty Laryngoscope Size: Mac and 4 Grade View: Grade II Tube type: Oral Tube size: 7.0 mm Number of attempts: 1 Airway Equipment and Method: Stylet Placement Confirmation: ETT inserted through vocal cords under direct vision,  breath sounds checked- equal and bilateral and positive ETCO2 Secured at: 21 cm Tube secured with: Tape Dental Injury: Teeth and Oropharynx as per pre-operative assessment

## 2019-12-31 NOTE — Interval H&P Note (Signed)
History and Physical Interval Note:  12/31/2019 8:10 AM  Tara Fuller  has presented today for surgery, with the diagnosis of hsitory breast ca, acquired absence right breast, intradermal nevi chin, left neck, nevus right groin.  The various methods of treatment have been discussed with the patient and family. After consideration of risks, benefits and other options for treatment, the patient has consented to  Procedure(s): MASTOPEXY (Left) POSSIBLE SILICONE IMPLANT REMOVAL (Left) EXCISION BENIGN LESION CHIN <1CM, LAYERED CLOSURE CHIN 1CM (N/A) EXCISION BENIGN LESION RIGHT GROIN <1CM, LAYERED CLOSURE TRUNK 1CM (Right) as a surgical intervention.  The patient's history has been reviewed, patient examined, no change in status, stable for surgery.  I have reviewed the patient's chart and labs.  Questions were answered to the patient's satisfaction.     Tara Fuller

## 2020-01-01 ENCOUNTER — Encounter (HOSPITAL_BASED_OUTPATIENT_CLINIC_OR_DEPARTMENT_OTHER): Payer: Self-pay | Admitting: Plastic Surgery

## 2020-01-01 LAB — SURGICAL PATHOLOGY

## 2020-05-26 ENCOUNTER — Other Ambulatory Visit: Payer: Self-pay | Admitting: General Surgery

## 2020-05-26 DIAGNOSIS — Z1231 Encounter for screening mammogram for malignant neoplasm of breast: Secondary | ICD-10-CM

## 2020-08-04 ENCOUNTER — Ambulatory Visit
Admission: RE | Admit: 2020-08-04 | Discharge: 2020-08-04 | Disposition: A | Discharge status: Home / Self Care | Payer: No Typology Code available for payment source | Source: Ambulatory Visit | Attending: General Surgery | Admitting: General Surgery

## 2020-08-04 ENCOUNTER — Other Ambulatory Visit: Payer: Self-pay | Admitting: General Surgery

## 2020-08-04 ENCOUNTER — Other Ambulatory Visit: Payer: Self-pay

## 2020-08-04 DIAGNOSIS — Z1231 Encounter for screening mammogram for malignant neoplasm of breast: Secondary | ICD-10-CM

## 2020-08-06 ENCOUNTER — Other Ambulatory Visit: Payer: Self-pay | Admitting: General Surgery

## 2020-08-06 DIAGNOSIS — R928 Other abnormal and inconclusive findings on diagnostic imaging of breast: Secondary | ICD-10-CM

## 2020-08-21 ENCOUNTER — Other Ambulatory Visit: Payer: Self-pay | Admitting: General Surgery

## 2020-08-21 ENCOUNTER — Ambulatory Visit
Admission: RE | Admit: 2020-08-21 | Discharge: 2020-08-21 | Disposition: A | Discharge status: Home / Self Care | Payer: No Typology Code available for payment source | Source: Ambulatory Visit | Attending: General Surgery | Admitting: General Surgery

## 2020-08-21 ENCOUNTER — Other Ambulatory Visit: Payer: Self-pay

## 2020-08-21 DIAGNOSIS — R928 Other abnormal and inconclusive findings on diagnostic imaging of breast: Secondary | ICD-10-CM

## 2020-08-25 ENCOUNTER — Other Ambulatory Visit: Payer: No Typology Code available for payment source

## 2020-09-01 ENCOUNTER — Other Ambulatory Visit: Payer: Self-pay | Admitting: General Surgery

## 2020-09-01 ENCOUNTER — Ambulatory Visit
Admission: RE | Admit: 2020-09-01 | Discharge: 2020-09-01 | Disposition: A | Discharge status: Home / Self Care | Payer: No Typology Code available for payment source | Source: Ambulatory Visit | Attending: General Surgery | Admitting: General Surgery

## 2020-09-01 ENCOUNTER — Other Ambulatory Visit: Payer: Self-pay

## 2020-09-01 DIAGNOSIS — R928 Other abnormal and inconclusive findings on diagnostic imaging of breast: Secondary | ICD-10-CM

## 2020-09-01 DIAGNOSIS — N6002 Solitary cyst of left breast: Secondary | ICD-10-CM

## 2020-09-01 HISTORY — PX: BREAST CYST ASPIRATION: SHX578

## 2021-02-09 ENCOUNTER — Ambulatory Visit: Payer: No Typology Code available for payment source

## 2021-02-09 ENCOUNTER — Ambulatory Visit
Admission: RE | Admit: 2021-02-09 | Discharge: 2021-02-09 | Disposition: A | Discharge status: Home / Self Care | Payer: No Typology Code available for payment source | Source: Ambulatory Visit | Attending: General Surgery | Admitting: General Surgery

## 2021-02-09 DIAGNOSIS — N6002 Solitary cyst of left breast: Secondary | ICD-10-CM

## 2022-01-12 ENCOUNTER — Other Ambulatory Visit: Payer: Self-pay | Admitting: General Surgery

## 2022-01-12 DIAGNOSIS — Z1231 Encounter for screening mammogram for malignant neoplasm of breast: Secondary | ICD-10-CM

## 2022-03-08 ENCOUNTER — Ambulatory Visit
Admission: RE | Admit: 2022-03-08 | Discharge: 2022-03-08 | Disposition: A | Discharge status: Home / Self Care | Payer: No Typology Code available for payment source | Source: Ambulatory Visit | Attending: General Surgery | Admitting: General Surgery

## 2022-03-08 DIAGNOSIS — Z1231 Encounter for screening mammogram for malignant neoplasm of breast: Secondary | ICD-10-CM

## 2022-03-11 ENCOUNTER — Other Ambulatory Visit: Payer: Self-pay | Admitting: General Surgery

## 2022-03-11 DIAGNOSIS — R928 Other abnormal and inconclusive findings on diagnostic imaging of breast: Secondary | ICD-10-CM

## 2022-03-18 ENCOUNTER — Ambulatory Visit
Admission: RE | Admit: 2022-03-18 | Discharge: 2022-03-18 | Disposition: A | Discharge status: Home / Self Care | Payer: No Typology Code available for payment source | Source: Ambulatory Visit | Attending: General Surgery | Admitting: General Surgery

## 2022-03-18 DIAGNOSIS — R928 Other abnormal and inconclusive findings on diagnostic imaging of breast: Secondary | ICD-10-CM

## 2022-03-19 ENCOUNTER — Other Ambulatory Visit: Payer: Self-pay | Admitting: General Surgery

## 2022-03-19 DIAGNOSIS — N6320 Unspecified lump in the left breast, unspecified quadrant: Secondary | ICD-10-CM

## 2022-03-30 ENCOUNTER — Ambulatory Visit
Admission: RE | Admit: 2022-03-30 | Discharge: 2022-03-30 | Disposition: A | Discharge status: Home / Self Care | Payer: No Typology Code available for payment source | Source: Ambulatory Visit | Attending: General Surgery | Admitting: General Surgery

## 2022-03-30 DIAGNOSIS — N6320 Unspecified lump in the left breast, unspecified quadrant: Secondary | ICD-10-CM

## 2022-03-30 HISTORY — PX: BREAST BIOPSY: SHX20

## 2023-01-21 ENCOUNTER — Other Ambulatory Visit: Payer: Self-pay | Admitting: General Surgery

## 2023-01-21 DIAGNOSIS — Z1231 Encounter for screening mammogram for malignant neoplasm of breast: Secondary | ICD-10-CM

## 2023-03-14 ENCOUNTER — Ambulatory Visit
Admission: RE | Admit: 2023-03-14 | Discharge: 2023-03-14 | Disposition: A | Discharge status: Home / Self Care | Payer: No Typology Code available for payment source | Source: Ambulatory Visit | Attending: General Surgery | Admitting: General Surgery

## 2023-03-14 DIAGNOSIS — Z1231 Encounter for screening mammogram for malignant neoplasm of breast: Secondary | ICD-10-CM

## 2023-10-29 IMAGING — MG MM DIGITAL DIAGNOSTIC UNILAT*L* W/ TOMO W/ CAD
4 series · 4 of 12 positions shown · non-contrast
Comparison: Previous exam(s).

CLINICAL DATA: 46-year-old female presenting for six-month
follow-up of the left breast following a benign cyst aspiration.

EXAM:
DIGITAL DIAGNOSTIC UNILATERAL LEFT MAMMOGRAM WITH TOMOSYNTHESIS AND
CAD
TECHNIQUE: Left digital diagnostic mammography and breast tomosynthesis was
performed. The images were evaluated with computer-aided detection.

[L MLO synth-2D]
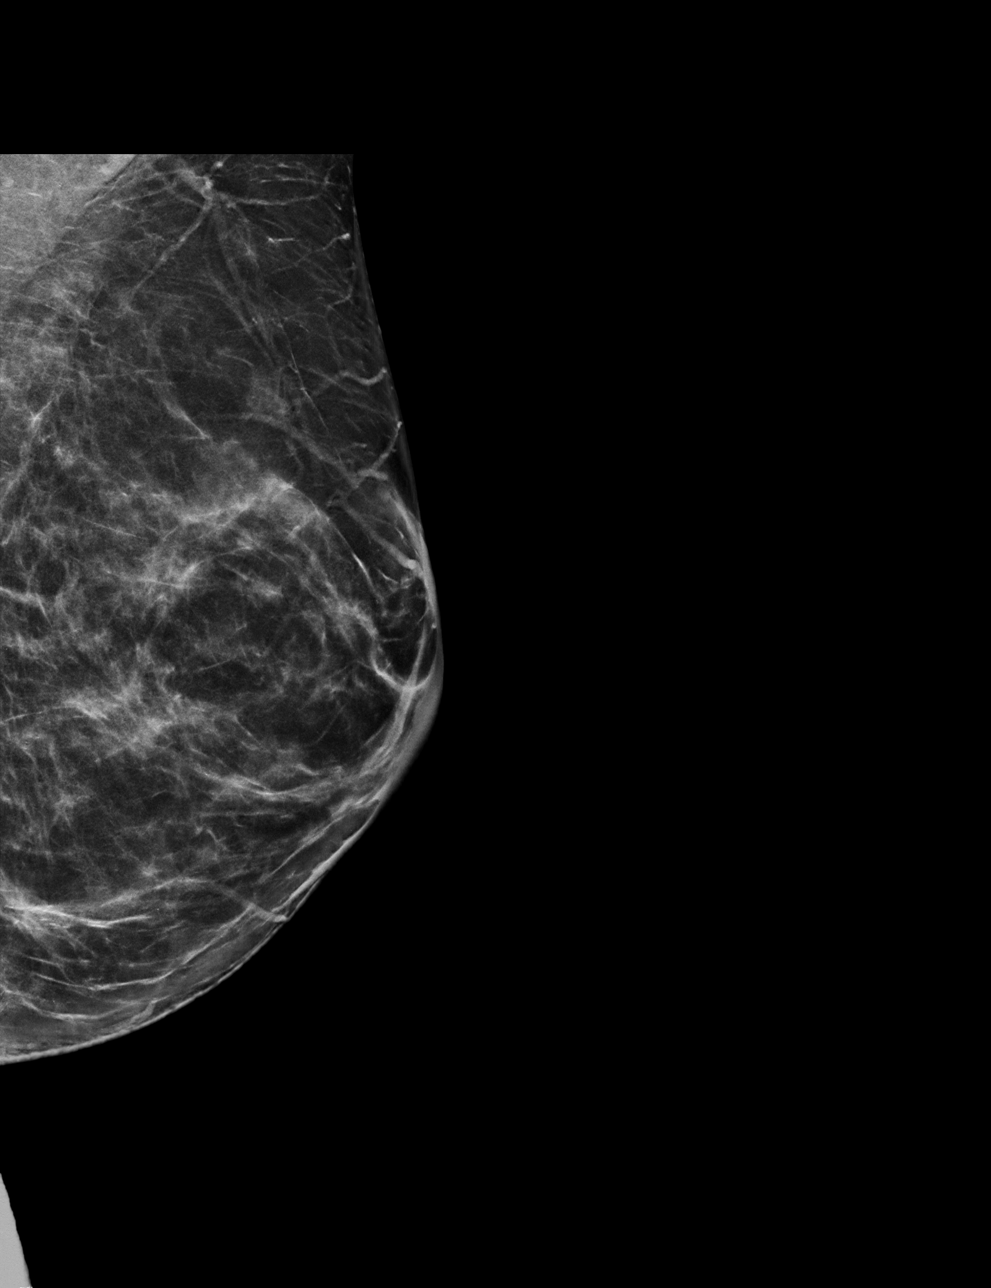

[L CC synth-2D]
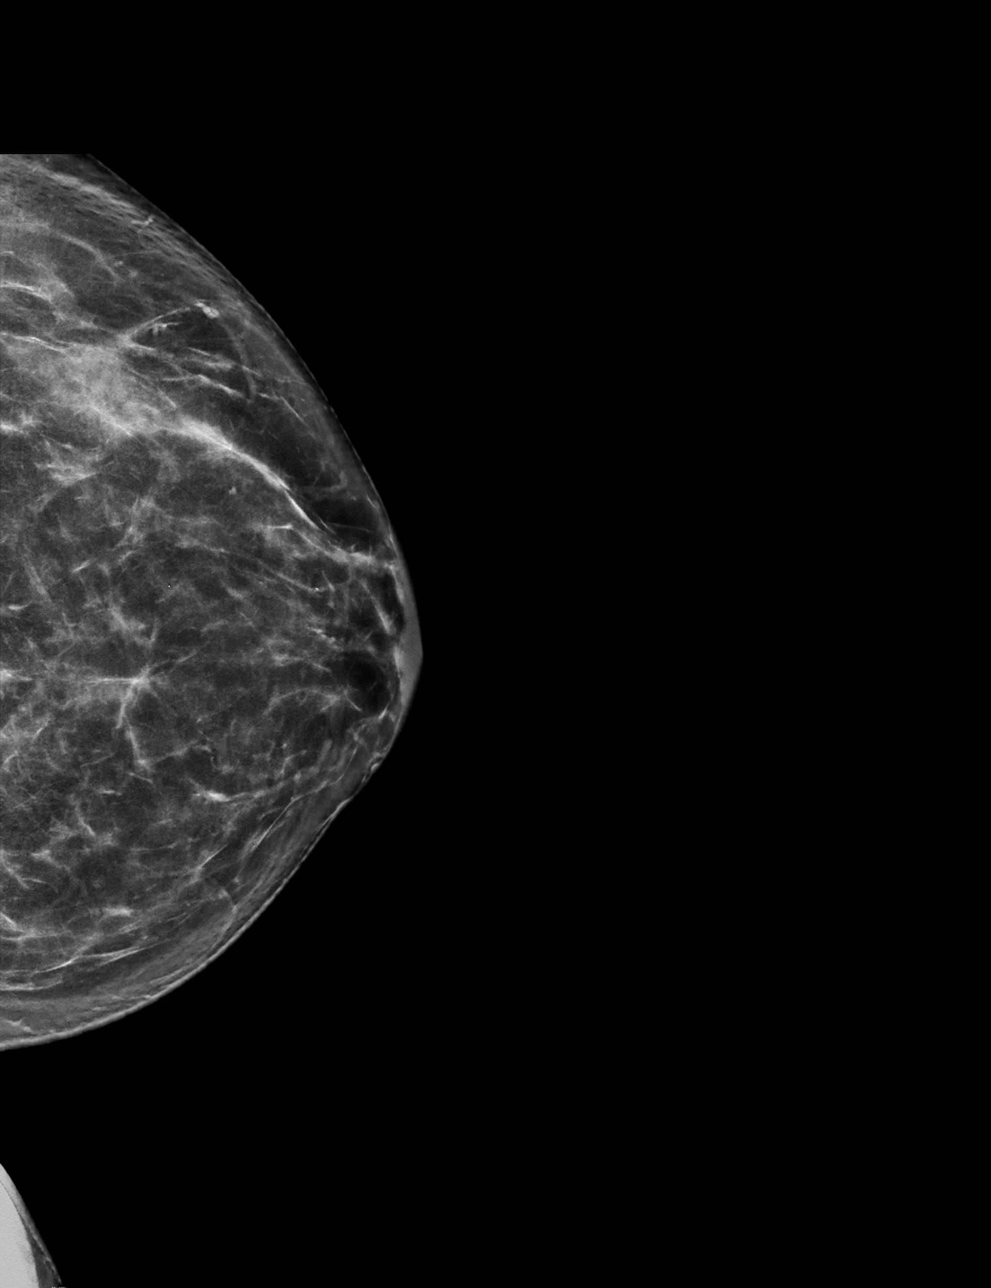

[L MLO tomo · tomo slice 33/64.0]
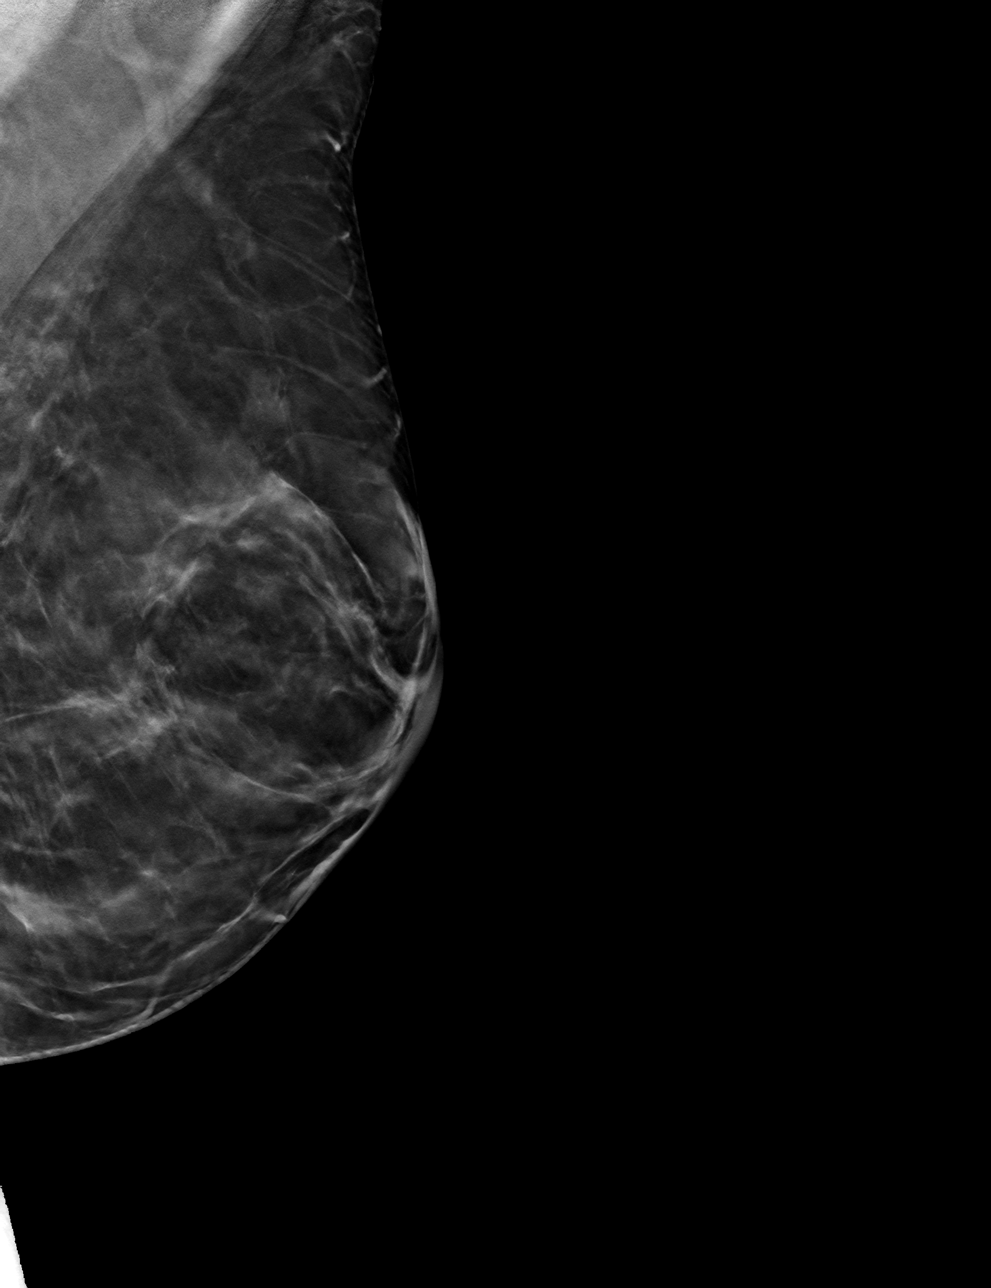

[L CC tomo · tomo slice 33/66.0]
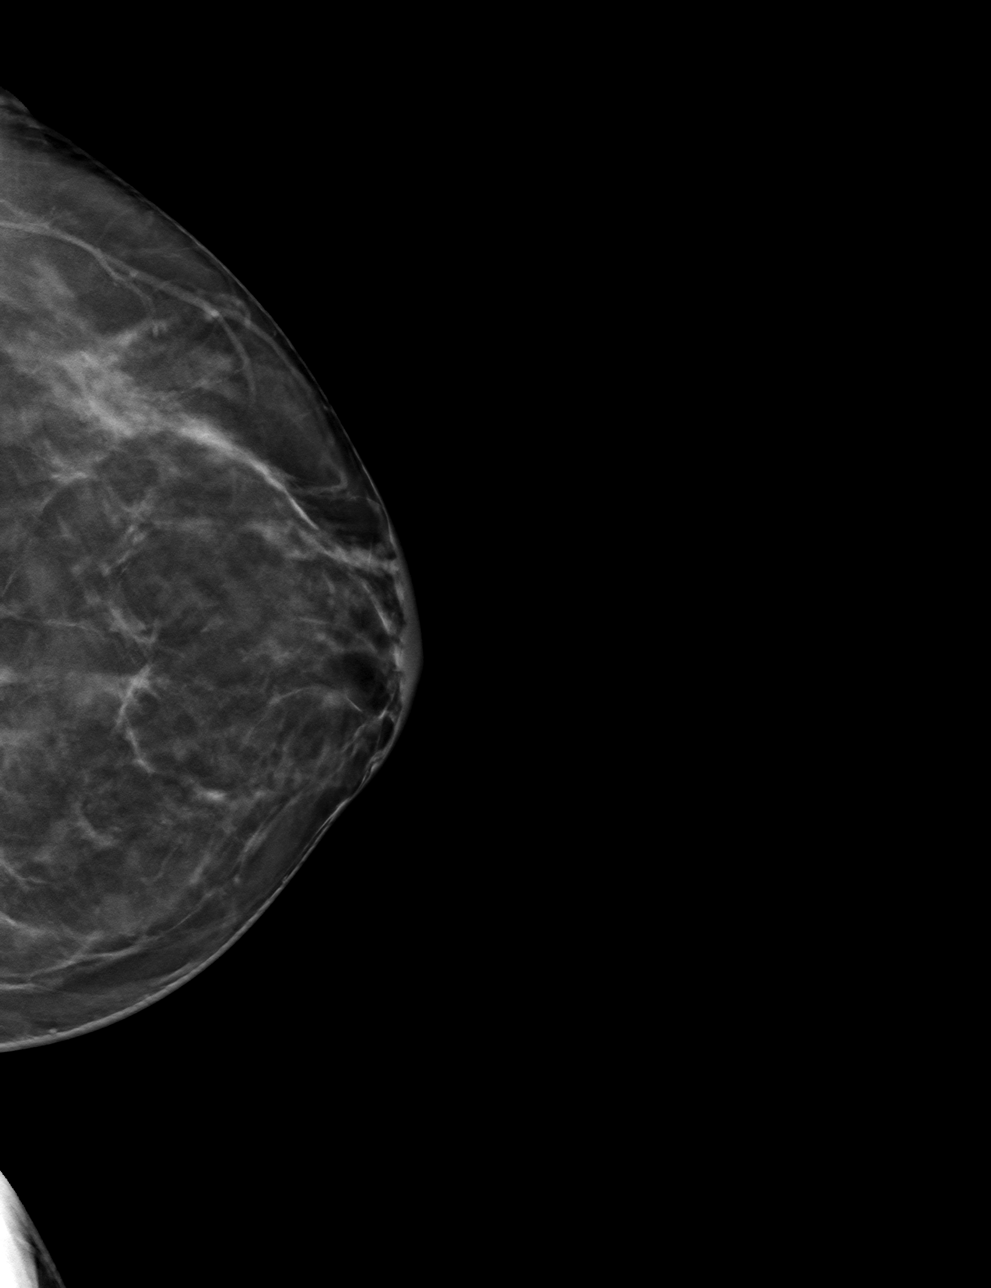

[4 of 12 positions shown; findings below may reference images not displayed]

ACR Breast Density Category c: The breast tissue is heterogeneously
dense, which may obscure small masses.
FINDINGS: Full field tomosynthesis views of the left breast were performed.
Stable appearance of the upper outer breast in the area of a
previously aspirated cyst. There is no new mass, asymmetry or
distortion. There are no new findings elsewhere in the left breast
to suggest the presence of malignancy.
IMPRESSION: No mammographic evidence of malignancy in the left breast.

RECOMMENDATION:
1.  Screening mammogram in one year.(Code:[id number])

2. According to the 8107 ACR recommendations, annual surveillance
MRI is recommended in women with personal histories of breast cancer
and dense breast tissue, or those diagnosed before age 50.
(Tiger DL, et al. (8107) Breast cancer screening in women at
higher-than-average risk: recommendations from the ACR. [HOSPITAL] [DATE]).

I have discussed the findings and recommendations with the patient.
If applicable, a reminder letter will be sent to the patient
regarding the next appointment.

BI-RADS CATEGORY  1: Negative.

## 2024-02-09 ENCOUNTER — Other Ambulatory Visit: Payer: Self-pay | Admitting: Plastic Surgery

## 2024-02-09 DIAGNOSIS — Z1231 Encounter for screening mammogram for malignant neoplasm of breast: Secondary | ICD-10-CM

## 2024-03-19 ENCOUNTER — Ambulatory Visit
